# Patient Record
Sex: Male | Born: 1955 | Race: White | Hispanic: No | Marital: Married | Smoking: Never smoker
Health system: Southern US, Community
[De-identification: ages and names within clinical notes are randomized; demographics above are authoritative.]

## PROBLEM LIST (undated history)

## (undated) DIAGNOSIS — M1990 Unspecified osteoarthritis, unspecified site: Secondary | ICD-10-CM

## (undated) DIAGNOSIS — J45909 Unspecified asthma, uncomplicated: Secondary | ICD-10-CM

## (undated) DIAGNOSIS — G709 Myoneural disorder, unspecified: Secondary | ICD-10-CM

## (undated) DIAGNOSIS — I10 Essential (primary) hypertension: Secondary | ICD-10-CM

## (undated) HISTORY — PX: KNEE SURGERY: SHX244

## (undated) HISTORY — PX: NECK SURGERY: SHX720

---

## 1997-07-21 HISTORY — PX: CERVICAL DISC SURGERY: SHX588

## 1997-12-14 ENCOUNTER — Inpatient Hospital Stay (HOSPITAL_COMMUNITY): Admission: RE | Admit: 1997-12-14 | Discharge: 1997-12-16 | Payer: Self-pay | Admitting: Neurosurgery

## 1998-08-15 ENCOUNTER — Ambulatory Visit (HOSPITAL_BASED_OUTPATIENT_CLINIC_OR_DEPARTMENT_OTHER): Admission: RE | Admit: 1998-08-15 | Discharge: 1998-08-15 | Payer: Self-pay | Admitting: Orthopedic Surgery

## 1999-01-22 ENCOUNTER — Emergency Department (HOSPITAL_COMMUNITY): Admission: EM | Admit: 1999-01-22 | Discharge: 1999-01-22 | Payer: Self-pay | Admitting: Emergency Medicine

## 1999-07-08 ENCOUNTER — Encounter: Payer: Self-pay | Admitting: Orthopedic Surgery

## 1999-07-08 ENCOUNTER — Ambulatory Visit (HOSPITAL_COMMUNITY): Admission: RE | Admit: 1999-07-08 | Discharge: 1999-07-08 | Payer: Self-pay | Admitting: Orthopedic Surgery

## 1999-08-02 ENCOUNTER — Ambulatory Visit (HOSPITAL_BASED_OUTPATIENT_CLINIC_OR_DEPARTMENT_OTHER): Admission: RE | Admit: 1999-08-02 | Discharge: 1999-08-03 | Payer: Self-pay | Admitting: Orthopedic Surgery

## 2001-05-26 ENCOUNTER — Ambulatory Visit (HOSPITAL_BASED_OUTPATIENT_CLINIC_OR_DEPARTMENT_OTHER): Admission: RE | Admit: 2001-05-26 | Discharge: 2001-05-26 | Payer: Self-pay | Admitting: Orthopedic Surgery

## 2010-10-14 ENCOUNTER — Other Ambulatory Visit: Payer: Self-pay | Admitting: Neurological Surgery

## 2010-10-14 DIAGNOSIS — M542 Cervicalgia: Secondary | ICD-10-CM

## 2010-11-08 ENCOUNTER — Ambulatory Visit
Admission: RE | Admit: 2010-11-08 | Discharge: 2010-11-08 | Disposition: A | Discharge status: Home / Self Care | Payer: BC Managed Care – PPO | Source: Ambulatory Visit | Attending: Neurological Surgery | Admitting: Neurological Surgery

## 2010-11-08 ENCOUNTER — Other Ambulatory Visit: Payer: Self-pay | Admitting: Neurological Surgery

## 2010-11-08 DIAGNOSIS — M542 Cervicalgia: Secondary | ICD-10-CM

## 2011-02-04 ENCOUNTER — Other Ambulatory Visit: Payer: Self-pay | Admitting: Neurological Surgery

## 2011-02-04 ENCOUNTER — Ambulatory Visit
Admission: RE | Admit: 2011-02-04 | Discharge: 2011-02-04 | Disposition: A | Discharge status: Home / Self Care | Payer: BC Managed Care – PPO | Source: Ambulatory Visit | Attending: Neurological Surgery | Admitting: Neurological Surgery

## 2011-02-04 DIAGNOSIS — M542 Cervicalgia: Secondary | ICD-10-CM

## 2011-02-04 DIAGNOSIS — M47812 Spondylosis without myelopathy or radiculopathy, cervical region: Secondary | ICD-10-CM

## 2011-05-06 ENCOUNTER — Other Ambulatory Visit: Payer: Self-pay | Admitting: Neurological Surgery

## 2011-05-06 ENCOUNTER — Ambulatory Visit
Admission: RE | Admit: 2011-05-06 | Discharge: 2011-05-06 | Disposition: A | Discharge status: Home / Self Care | Payer: BC Managed Care – PPO | Source: Ambulatory Visit | Attending: Neurological Surgery | Admitting: Neurological Surgery

## 2011-05-06 DIAGNOSIS — M542 Cervicalgia: Secondary | ICD-10-CM

## 2011-05-06 DIAGNOSIS — M47812 Spondylosis without myelopathy or radiculopathy, cervical region: Secondary | ICD-10-CM

## 2013-01-31 ENCOUNTER — Emergency Department (HOSPITAL_COMMUNITY): Payer: BC Managed Care – PPO

## 2013-01-31 ENCOUNTER — Observation Stay (HOSPITAL_COMMUNITY)
Admission: EM | Admit: 2013-01-31 | Discharge: 2013-02-02 | Disposition: A | Discharge status: Home / Self Care | Payer: BC Managed Care – PPO | Attending: Family Medicine | Admitting: Family Medicine

## 2013-01-31 ENCOUNTER — Encounter (HOSPITAL_COMMUNITY): Payer: Self-pay | Admitting: *Deleted

## 2013-01-31 DIAGNOSIS — J45909 Unspecified asthma, uncomplicated: Secondary | ICD-10-CM | POA: Insufficient documentation

## 2013-01-31 DIAGNOSIS — I498 Other specified cardiac arrhythmias: Secondary | ICD-10-CM | POA: Insufficient documentation

## 2013-01-31 DIAGNOSIS — Z79899 Other long term (current) drug therapy: Secondary | ICD-10-CM | POA: Insufficient documentation

## 2013-01-31 DIAGNOSIS — R0789 Other chest pain: Principal | ICD-10-CM | POA: Diagnosis present

## 2013-01-31 DIAGNOSIS — R0602 Shortness of breath: Secondary | ICD-10-CM

## 2013-01-31 DIAGNOSIS — I10 Essential (primary) hypertension: Secondary | ICD-10-CM

## 2013-01-31 DIAGNOSIS — R001 Bradycardia, unspecified: Secondary | ICD-10-CM | POA: Diagnosis present

## 2013-01-31 HISTORY — DX: Unspecified asthma, uncomplicated: J45.909

## 2013-01-31 HISTORY — DX: Essential (primary) hypertension: I10

## 2013-01-31 LAB — BASIC METABOLIC PANEL
BUN: 16 mg/dL (ref 6–23)
CO2: 28 mEq/L (ref 19–32)
Calcium: 9.7 mg/dL (ref 8.4–10.5)
Chloride: 101 mEq/L (ref 96–112)
Creatinine, Ser: 1.01 mg/dL (ref 0.50–1.35)
GFR calc Af Amer: >90 mL/min (ref >90)
GFR calc non Af Amer: 81 mL/min — ABNORMAL LOW (ref >90)
Glucose, Bld: 86 mg/dL (ref 70–99)
Potassium: 3.6 mEq/L (ref 3.5–5.1)
Sodium: 138 mEq/L (ref 135–145)

## 2013-01-31 LAB — CBC
HCT: 43.8 % (ref 39.0–52.0)
HCT: 43.4 % (ref 39.0–52.0)
Hemoglobin: 15.3 g/dL (ref 13.0–17.0)
Hemoglobin: 15 g/dL (ref 13.0–17.0)
MCH: 31.6 pg (ref 26.0–34.0)
MCH: 31.4 pg (ref 26.0–34.0)
MCHC: 34.9 g/dL (ref 30.0–36.0)
MCHC: 34.6 g/dL (ref 30.0–36.0)
MCV: 90.5 fL (ref 78.0–100.0)
MCV: 91 fL (ref 78.0–100.0)
Platelets: 227 10*3/uL (ref 150–400)
Platelets: 233 10*3/uL (ref 150–400)
RBC: 4.84 MIL/uL (ref 4.22–5.81)
RBC: 4.77 MIL/uL (ref 4.22–5.81)
RDW: 12.5 % (ref 11.5–15.5)
RDW: 12.5 % (ref 11.5–15.5)
WBC: 7.3 10*3/uL (ref 4.0–10.5)
WBC: 7.4 10*3/uL (ref 4.0–10.5)

## 2013-01-31 LAB — POCT I-STAT TROPONIN I: Troponin i, poc: 0 ng/mL (ref 0.00–0.08)

## 2013-01-31 LAB — CREATININE, SERUM
Creatinine, Ser: 1.03 mg/dL (ref 0.50–1.35)
GFR calc Af Amer: >90 mL/min (ref >90)
GFR calc non Af Amer: 79 mL/min — ABNORMAL LOW (ref >90)

## 2013-01-31 LAB — D-DIMER, QUANTITATIVE (NOT AT ARMC): D-Dimer, Quant: <0.27 ug/mL-FEU (ref 0.00–0.48)

## 2013-01-31 LAB — TROPONIN I
Troponin I: <0.3 ng/mL (ref <0.30)
Troponin I: <0.3 ng/mL (ref <0.30)

## 2013-01-31 MED ORDER — SODIUM CHLORIDE 0.9 % IJ SOLN: 3.0000 mL | INTRAMUSCULAR | Status: DC | PRN

## 2013-01-31 MED ORDER — LOSARTAN POTASSIUM-HCTZ 50-12.5 MG PO TABS: 1.0000 | ORAL_TABLET | Freq: Every day | ORAL | Status: DC

## 2013-01-31 MED ORDER — ACETAMINOPHEN 325 MG PO TABS: 650.0000 mg | ORAL_TABLET | Freq: Four times a day (QID) | ORAL | Status: DC | PRN

## 2013-01-31 MED ORDER — LOSARTAN POTASSIUM 50 MG PO TABS
50.0000 mg | ORAL_TABLET | Freq: Every day | ORAL | Status: DC
  Administered 2013-01-31 – 2013-02-02 (×3): 50 mg via ORAL
  Filled 2013-01-31 (×3): qty 1

## 2013-01-31 MED ORDER — SODIUM CHLORIDE 0.9 % IV SOLN: 250.0000 mL | INTRAVENOUS | Status: DC | PRN

## 2013-01-31 MED ORDER — ASPIRIN 81 MG PO CHEW
324.0000 mg | CHEWABLE_TABLET | Freq: Once | ORAL | Status: AC
  Administered 2013-01-31: 324 mg via ORAL
  Filled 2013-01-31: qty 4

## 2013-01-31 MED ORDER — HEPARIN SODIUM (PORCINE) 5000 UNIT/ML IJ SOLN
5000.0000 [IU] | Freq: Three times a day (TID) | INTRAMUSCULAR | Status: DC
  Filled 2013-01-31 (×8): qty 1

## 2013-01-31 MED ORDER — SODIUM CHLORIDE 0.9 % IJ SOLN: 3.0000 mL | Freq: Two times a day (BID) | INTRAMUSCULAR | Status: DC

## 2013-01-31 MED ORDER — ACETAMINOPHEN 650 MG RE SUPP: 650.0000 mg | Freq: Four times a day (QID) | RECTAL | Status: DC | PRN

## 2013-01-31 MED ORDER — ASPIRIN EC 325 MG PO TBEC
325.0000 mg | DELAYED_RELEASE_TABLET | Freq: Every day | ORAL | Status: DC
  Administered 2013-01-31 – 2013-02-02 (×3): 325 mg via ORAL
  Filled 2013-01-31 (×3): qty 1

## 2013-01-31 MED ORDER — SODIUM CHLORIDE 0.9 % IJ SOLN
3.0000 mL | Freq: Two times a day (BID) | INTRAMUSCULAR | Status: DC
  Administered 2013-01-31 – 2013-02-01 (×3): 3 mL via INTRAVENOUS

## 2013-01-31 MED ORDER — HYDROCODONE-ACETAMINOPHEN 5-325 MG PO TABS: 1.0000 | ORAL_TABLET | ORAL | Status: DC | PRN

## 2013-01-31 MED ORDER — HYDROCHLOROTHIAZIDE 12.5 MG PO CAPS
12.5000 mg | ORAL_CAPSULE | Freq: Every day | ORAL | Status: DC
  Administered 2013-01-31 – 2013-02-02 (×3): 12.5 mg via ORAL
  Filled 2013-01-31 (×3): qty 1

## 2013-01-31 NOTE — Progress Notes (Signed)
Patient reports his pcp is Dr. Tiburcio Pea of [facility].

## 2013-01-31 NOTE — H&P (Signed)
[facility] History and Physical  Daniel Rush JYN:[id number] DOB: 1956/03/15 DOA: 01/31/2013  Referring physician: Dr. Danae Chen PCP: Provider Not In System  Specialists: none  Chief Complaint: Atypical chest pain  HPI: Daniel Rush is a 57 y.o. male  With past medical history of hypertension that comes in chest discomfort this started 2 weeks prior to admission. He relates usually swims and runs sometimes up to an hour and a half. The first time he got this episode in the past 2 weeks was while doing this. He relates he's gotten them more often to the point where he gets him where his sitting down. He relates is no relationship with food, he may some shortness of breath with this episode, no palpitation no shortness of breath nausea vomiting or diarrhea. He relates nothing makes it better or worse. He exercises regularly.  In the ED: EKG was done that showed sinus bradycardia with no abnormal T-wave changes some LVH pattern, first set of cardiac enzymes are negative so we are consulted for further evaluation.  Review of Systems: The patient denies anorexia, fever, weight loss,, vision loss, decreased hearing, hoarseness, chest pain, peripheral edema, balance deficits, hemoptysis, abdominal pain, melena, hematochezia, severe indigestion/heartburn, hematuria, incontinence, genital sores, muscle weakness, suspicious skin lesions, transient blindness, difficulty walking, depression, unusual weight change, abnormal bleeding, enlarged lymph nodes, angioedema, and breast masses.    Past Medical History  Diagnosis Date  . Asthma   . Hypertension    Past Surgical History  Procedure Laterality Date  . Neck surgery    . Knee surgery     Social History:  reports that he has never smoked. He does not have any smokeless tobacco history on file. He reports that he drinks about 1.2 ounces of alcohol per week. He reports that he does not use illicit drugs.   Allergies  Allergen Reactions   . Latex Rash    Family History  Problem Relation Age of Onset  . Alzheimer's disease Mother   . Hypertension Mother   . Diabetes Mellitus II Mother   . Heart failure Father   . Diabetes Mellitus II Father   . Hypertension Father    Prior to Admission medications   Medication Sig Start Date End Date Taking? Authorizing Provider  Cyanocobalamin (VITAMIN B-12 PO) Take 1 capsule by mouth daily.   Yes Historical Provider, MD  LECITHIN PO Take 1 capsule by mouth daily.   Yes Historical Provider, MD  losartan-hydrochlorothiazide (HYZAAR) 50-12.5 MG per tablet Take 1 tablet by mouth daily.   Yes Historical Provider, MD  Multiple Vitamins-Minerals (MULTIVITAMIN WITH MINERALS) tablet Take 1 tablet by mouth daily.   Yes Historical Provider, MD  ST JOHNS WORT PO Take 1 tablet by mouth.   Yes Historical Provider, MD  testosterone (ANDROGEL) 50 MG/5GM GEL Place 5 g onto the skin daily.   Yes Historical Provider, MD  VITAMIN E PO Take 1 capsule by mouth.   Yes Historical Provider, MD   Physical Exam: Filed Vitals:   01/31/13 1342  BP: 145/81  Pulse: 53  Temp: 98.1 F (36.7 C)  TempSrc: Oral  Resp: 16  SpO2: 98%    BP 145/81  Pulse 53  Temp(Src) 98.1 F (36.7 C) (Oral)  Resp 16  SpO2 98%  General Appearance:    Alert, cooperative, no distress, appears stated age, well built               Throat:   Lips, mucosa, and tongue normal; teeth  and gums normal  Neck:   Supple, symmetrical, trachea midline, no adenopathy;       thyroid:  No enlargement/tenderness/nodules; no carotid   bruit or JVD  Back:     Symmetric, no curvature, ROM normal, no CVA tenderness  Lungs:     Clear to auscultation bilaterally, respirations unlabored  Chest wall:    No tenderness or deformity  Heart:    Regular rate and rhythm, S1 and S2 normal, systolic murmur in the aortic area.   Abdomen:     Soft, non-tender, bowel sounds active all four quadrants,    no masses, no organomegaly        Extremities:    Extremities normal, atraumatic, no cyanosis or edema  Pulses:   2+ and symmetric all extremities  Skin:   Skin color, texture, turgor normal, no rashes or lesions  Lymph nodes:   Cervical, supraclavicular, and axillary nodes normal  Neurologic:   CNII-XII intact. Normal strength, sensation and reflexes      throughout      Labs on Admission:  Basic Metabolic Panel:  Recent Labs Lab 01/31/13 1430  NA 138  K 3.6  CL 101  CO2 28  GLUCOSE 86  BUN 16  CREATININE 1.01  CALCIUM 9.7   Liver Function Tests: No results found for this basename: AST, ALT, ALKPHOS, BILITOT, PROT, ALBUMIN,  in the last 168 hours No results found for this basename: LIPASE, AMYLASE,  in the last 168 hours No results found for this basename: AMMONIA,  in the last 168 hours CBC:  Recent Labs Lab 01/31/13 1430  WBC 7.3  HGB 15.3  HCT 43.8  MCV 90.5  PLT 227   Cardiac Enzymes:  Recent Labs Lab 01/31/13 1605  TROPONINI <0.30    BNP (last 3 results) No results found for this basename: PROBNP,  in the last 8760 hours CBG: No results found for this basename: GLUCAP,  in the last 168 hours  Radiological Exams on Admission: Dg Chest 2 View  01/31/2013   *RADIOLOGY REPORT*  Clinical Data: Chest pain  CHEST - 2 VIEW  Comparison: None.  Findings: Cardiomediastinal silhouette is unremarkable.  Mild thoracic dextroscoliosis.  No acute infiltrate or pleural effusion. No pulmonary edema.  IMPRESSION: No active disease.  Mild thoracic dextroscoliosis.   Original Report Authenticated By: Natasha Mead, M.D.    EKG: Independently reviewed. Sinus bradycardia with normal axis no T wave abnormalities LVH pattern.  Assessment/Plan Atypical chest pain: - I will go ahead and admit him under observation, to telemetry unit, we'll cycle his cardiac enzymes x3, we'll check a TSH and a 2-D echo. We'll give him aspirin orally. - His heart score is 2 if his cardiac enzymes remain negative the schedule stress test as an  outpatient.  Hypertension, essential: - Continue current home medications.  Sinus bradycardia: - He's currently on low rate and tone medication, he seems well built male who exercises regularly.   Code Status: full Family Communication: none Disposition Plan: observation  Time spent: 65 minutes  Marinda Elk [facility] Pager [phone]  If 7PM-7AM, please contact night-coverage [email or web address] Password [facility] 01/31/2013, 5:58 PM

## 2013-01-31 NOTE — ED Notes (Signed)
Pt presents to ed with  c/o sob x 1 week. Reports feels like "jumping into cold water and having your breath taken away". Pt reports hx of asthma but sts this is different. Denies any cardiac hx.

## 2013-01-31 NOTE — ED Provider Notes (Signed)
History    CSN: [id number] Arrival date & time 01/31/13  1328  First MD Initiated Contact with Patient 01/31/13 1502     Chief Complaint  Patient presents with  . Shortness of Breath   (Consider location/radiation/quality/duration/timing/severity/associated sxs/prior Treatment) HPI Comments: 57 yo male with htn hx presents with sob and chest pressure.  Brief (seconds) episodes daily, gradually worsening the past 2 weeks.  Pt has had similar random episodes but now more frequent and at times with exercise.  Pt healthy, exercises regular.  No diaphoresis or vomiting.  Patient denies blood clot history, active cancer, recent major trauma or surgery, unilateral leg swelling/ pain,, hemoptysis or oral contraceptives. Non radiating  Patient is a 57 y.o. male presenting with shortness of breath. The history is provided by the patient.  Shortness of Breath Severity:  Mild Associated symptoms: no abdominal pain, no chest pain (pressure), no fever, no headaches, no neck pain, no rash and no vomiting    Past Medical History  Diagnosis Date  . Asthma   . Hypertension    Past Surgical History  Procedure Laterality Date  . Neck surgery    . Knee surgery     History reviewed. No pertinent family history. History  Substance Use Topics  . Smoking status: Never Smoker   . Smokeless tobacco: Not on file  . Alcohol Use: 1.2 oz/week    2 Glasses of wine per week     Comment: daily with dinner    Review of Systems  Constitutional: Negative for fever and chills.  HENT: Negative for neck pain and neck stiffness.   Eyes: Negative for visual disturbance.  Respiratory: Positive for shortness of breath.   Cardiovascular: Negative for chest pain (pressure).  Gastrointestinal: Negative for vomiting and abdominal pain.  Genitourinary: Negative for dysuria and flank pain.  Musculoskeletal: Negative for back pain.  Skin: Negative for rash.  Neurological: Negative for light-headedness and headaches.     Allergies  Latex  Home Medications   Current Outpatient Rx  Name  Route  Sig  Dispense  Refill  . Cyanocobalamin (VITAMIN B-12 PO)   Oral   Take 1 capsule by mouth daily.         Marland Kitchen LECITHIN PO   Oral   Take 1 capsule by mouth daily.         Marland Kitchen losartan-hydrochlorothiazide (HYZAAR) 50-12.5 MG per tablet   Oral   Take 1 tablet by mouth daily.         . Multiple Vitamins-Minerals (MULTIVITAMIN WITH MINERALS) tablet   Oral   Take 1 tablet by mouth daily.         . ST JOHNS WORT PO   Oral   Take 1 tablet by mouth.         . testosterone (ANDROGEL) 50 MG/5GM GEL   Transdermal   Place 5 g onto the skin daily.         Marland Kitchen VITAMIN E PO   Oral   Take 1 capsule by mouth.          BP 145/81  Pulse 53  Temp(Src) 98.1 F (36.7 C) (Oral)  Resp 16  SpO2 98% Physical Exam  Nursing note and vitals reviewed. Constitutional: He is oriented to person, place, and time. He appears well-developed and well-nourished.  HENT:  Head: Normocephalic and atraumatic.  Eyes: Conjunctivae are normal. Right eye exhibits no discharge. Left eye exhibits no discharge.  Neck: Normal range of motion. Neck supple. No tracheal deviation  present.  Cardiovascular: Normal rate and regular rhythm.   Murmur (2+ SM LSB) heard. Pulmonary/Chest: Effort normal and breath sounds normal.  Abdominal: Soft. He exhibits no distension. There is no tenderness. There is no guarding.  Musculoskeletal: He exhibits no edema and no tenderness.  Neurological: He is alert and oriented to person, place, and time.  Skin: Skin is warm. No rash noted.  Psychiatric: He has a normal mood and affect.    ED Course  Procedures (including critical care time) Labs Reviewed  BASIC METABOLIC PANEL - Abnormal; Notable for the following:    GFR calc non Af Amer 81 (*)    All other components within normal limits  CBC  D-DIMER, QUANTITATIVE  POCT I-STAT TROPONIN I   No results found. No diagnosis found.  MDM   Chest pain and sob episodes, sometimes with exertion. Work up in ED no acute findings, asa given. DIscussed outpt fup vs observation, pt chose observation for enzymes and possible stress. Spoke with Dr Radonna Ricker. Observation placed.  CP free  Date: 01/31/2013  Rate: 52   Rhythm: normal sinus rhythm  QRS Axis: normal  Intervals: normal  ST/T Wave abnormalities: normal  Conduction Disutrbances:none  RSR variant ?LVH  Narrative Interpretation:   Old EKG Reviewed: none available    Enid Skeens, MD 01/31/13 [phone]

## 2013-02-01 DIAGNOSIS — I498 Other specified cardiac arrhythmias: Secondary | ICD-10-CM

## 2013-02-01 DIAGNOSIS — R001 Bradycardia, unspecified: Secondary | ICD-10-CM | POA: Diagnosis present

## 2013-02-01 LAB — HEMOGLOBIN A1C
Hgb A1c MFr Bld: 5.3 % (ref <5.7)
Mean Plasma Glucose: 105 mg/dL (ref <117)

## 2013-02-01 LAB — TROPONIN I
Troponin I: <0.3 ng/mL (ref <0.30)
Troponin I: <0.3 ng/mL (ref <0.30)

## 2013-02-01 LAB — TSH: TSH: 2.301 u[IU]/mL (ref 0.350–4.500)

## 2013-02-01 NOTE — Progress Notes (Signed)
  Echocardiogram 2D Echocardiogram has been performed.  Cathie Beams 02/01/2013, 9:43 AM

## 2013-02-01 NOTE — Progress Notes (Signed)
Stopped to see patient today. Had prayer, offered encouragement and support. Listened.

## 2013-02-01 NOTE — Progress Notes (Signed)
[facility] PROGRESS NOTE  Daniel Rush ZOX:[id number] DOB: February 19, 1956 DOA: 01/31/2013 PCP: Provider Not In System  Brief narrative 57 year old male, physically quite active (elliptical, weightlifting and Karate), history of hypertension, admitted on 01/31/13 with complaints of chest pain. Patient gives history of fleeting chest pain in the midsternal area that occurred up to 3 times per year for the last 5 years. However over the last 2 weeks patient has noted increased frequency of chest pain. He indicates midsternal, pressure-like, nonradiating chest pain which occurs both with activity and at rest, last a few seconds and resolve spontaneously. He denies relationship to deep breathing or food. At times he may have to catch his breath. Denies heartburn. Hospitalist admission was requested.  Assessment/Plan: 1. Atypical chest pain: Cardiac enzymes cycled and negative. Discussed with cardiology who evaluated 2-D echo and? Lateral wall hypokinesis & LVEF 50-55%. Cardiology consultation appreciated-recommend stress test in a.m. Chest pain resolved for the last 24 hours. Hemoglobin A1c: 5.3. EKG: Sinus bradycardia at 52 beats per minute, RSR in V1 and V2 ? Normal variant and LVH pattern. 2. Hypertension: Controlled. Continue HCTZ and losartan. 3. Sinus bradycardia: Likely physiologic. Monitor on telemetry. TSH: 2.3  Code Status: Full Family Communication: Discussed with spouse at bedside. Disposition Plan: Home when medically cleared.   Consultants:  Cardiology  Procedures:  None  Antibiotics:  None   HPI/Subjective: No further chest pain since 7/14 a.m.  Objective: Filed Vitals:   01/31/13 1840 01/31/13 2036 02/01/13 0652 02/01/13 1343  BP: 133/77 134/81 142/85 120/66  Pulse: 51 49 47 57  Temp: 98.1 F (36.7 C) 98.2 F (36.8 C) 97.3 F (36.3 C) 97.8 F (36.6 C)  TempSrc: Oral Oral Oral Oral  Resp: 18 16 16 18   Height:  5\' 9"  (1.753 m)    Weight:  68.04 kg (150 lb)  67.903 kg (149 lb 11.2 oz)   SpO2: 98% 98% 99% 100%    Intake/Output Summary (Last 24 hours) at 02/01/13 1525 Last data filed at 02/01/13 1312  Gross per 24 hour  Intake    774 ml  Output      0 ml  Net    774 ml   Filed Weights   01/31/13 2036 02/01/13 0652  Weight: 68.04 kg (150 lb) 67.903 kg (149 lb 11.2 oz)    Exam:   General exam: Comfortable. Ambulating in room.  Respiratory system: Clear. No increased work of breathing. No reproducible chest wall tenderness.  Cardiovascular system: S1 & S2 heard, RRR. No JVD, murmurs, gallops, clicks or pedal edema. Telemetry shows sinus bradycardia mostly in the 50s but at times in the 40.  Gastrointestinal system: Abdomen is nondistended, soft and nontender. Normal bowel sounds heard.  Central nervous system: Alert and oriented. No focal neurological deficits.  Extremities: Symmetric 5 x 5 power.   Data Reviewed: Basic Metabolic Panel:  Recent Labs Lab 01/31/13 1430 01/31/13 2145  NA 138  --   K 3.6  --   CL 101  --   CO2 28  --   GLUCOSE 86  --   BUN 16  --   CREATININE 1.01 1.03  CALCIUM 9.7  --    Liver Function Tests: No results found for this basename: AST, ALT, ALKPHOS, BILITOT, PROT, ALBUMIN,  in the last 168 hours No results found for this basename: LIPASE, AMYLASE,  in the last 168 hours No results found for this basename: AMMONIA,  in the last 168 hours CBC:  Recent Labs Lab 01/31/13  1430 01/31/13 2145  WBC 7.3 7.4  HGB 15.3 15.0  HCT 43.8 43.4  MCV 90.5 91.0  PLT 227 233   Cardiac Enzymes:  Recent Labs Lab 01/31/13 1605 01/31/13 2145 02/01/13 0300 02/01/13 0750  TROPONINI <0.30 <0.30 <0.30 <0.30   BNP (last 3 results) No results found for this basename: PROBNP,  in the last 8760 hours CBG: No results found for this basename: GLUCAP,  in the last 168 hours  No results found for this or any previous visit (from the past 240 hour(s)).   Studies: Dg Chest 2 View  01/31/2013    *RADIOLOGY REPORT*  Clinical Data: Chest pain  CHEST - 2 VIEW  Comparison: None.  Findings: Cardiomediastinal silhouette is unremarkable.  Mild thoracic dextroscoliosis.  No acute infiltrate or pleural effusion. No pulmonary edema.  IMPRESSION: No active disease.  Mild thoracic dextroscoliosis.   Original Report Authenticated By: Natasha Mead, M.D.     Additional labs:   Scheduled Meds: . aspirin EC  325 mg Oral Daily  . heparin  5,000 Units Subcutaneous Q8H  . hydrochlorothiazide  12.5 mg Oral Daily  . losartan  50 mg Oral Daily  . sodium chloride  3 mL Intravenous Q12H  . sodium chloride  3 mL Intravenous Q12H   Continuous Infusions:   Principal Problem:   Atypical chest pain Active Problems:   Hypertension, essential   Sinus bradycardia    Time spent: 40 minutes    [facility]  [facility] Pager [phone].   If 8PM-8AM, please contact night-coverage at [email or web address], password [facility] 02/01/2013, 3:25 PM  LOS: 1 day

## 2013-02-01 NOTE — Consult Note (Signed)
Admit date: 01/31/2013 Referring Physician  Dr. Waymon Amato Primary Cardiologist  None Reason for Consultation  Chest pain  HPI: Daniel Rush is a 57 y.o. male with past medical history of hypertension that comes in chest discomfort that started 2 weeks prior to admission. He relates usually swimming and running sometimes up to an hour and a half. The first time he got this episode in the past 2 weeks was while doing this. He relates he's gotten them more often to the point where he gets them when he is sitting down. He relates there is no relationship with food, he may some shortness of breath with this episode, no palpitation,nausea, vomiting or diarrhea. He relates nothing makes it better or worse. He exercises regularly. The pain is sharp and only lasts a few seconds.  On EKG he has an incomplete RBBB with T wave inversions in V1 and V2 and LVH.  Cardiology is now asked to consult for workup of chest pain.        PMH:   Past Medical History  Diagnosis Date  . Asthma   . Hypertension      PSH:   Past Surgical History  Procedure Laterality Date  . Neck surgery    . Knee surgery      Allergies:  Latex Prior to Admit Meds:   Prescriptions prior to admission  Medication Sig Dispense Refill  . Cyanocobalamin (VITAMIN B-12 PO) Take 1 capsule by mouth daily.      Marland Kitchen LECITHIN PO Take 1 capsule by mouth daily.      Marland Kitchen losartan-hydrochlorothiazide (HYZAAR) 50-12.5 MG per tablet Take 1 tablet by mouth daily.      . Multiple Vitamins-Minerals (MULTIVITAMIN WITH MINERALS) tablet Take 1 tablet by mouth daily.      . ST JOHNS WORT PO Take 1 tablet by mouth.      . testosterone (ANDROGEL) 50 MG/5GM GEL Place 5 g onto the skin daily.      Marland Kitchen VITAMIN E PO Take 1 capsule by mouth.       Fam HX:    Family History  Problem Relation Age of Onset  . Alzheimer's disease Mother   . Hypertension Mother   . Diabetes Mellitus II Mother   . Heart failure Father   . Diabetes Mellitus II Father   .  Hypertension Father    Social HX:    History   Social History  . Marital Status: Married    Spouse Name: N/A    Number of Children: N/A  . Years of Education: N/A   Occupational History  . Not on file.   Social History Main Topics  . Smoking status: Never Smoker   . Smokeless tobacco: Not on file  . Alcohol Use: 1.2 oz/week    2 Glasses of wine per week     Comment: daily with dinner  . Drug Use: No  . Sexually Active: Yes   Other Topics Concern  . Not on file   Social History Narrative  . No narrative on file     ROS:  All 11 ROS were addressed and are negative except what is stated in the HPI  Physical Exam: Blood pressure 142/85, pulse 47, temperature 97.3 F (36.3 C), temperature source Oral, resp. rate 16, height 5\' 9"  (1.753 m), weight 67.903 kg (149 lb 11.2 oz), SpO2 99.00%.    General: Well developed, well nourished, in no acute distress Head: Eyes PERRLA, No xanthomas.   Normal cephalic and atramatic  Lungs:   Clear bilaterally to auscultation and percussion. Heart:   HRRR S1 S2 Pulses are 2+ & equal.            No carotid bruit. No JVD.  No abdominal bruits. No femoral bruits. Abdomen: Bowel sounds are positive, abdomen soft and non-tender without masses  Extremities:   No clubbing, cyanosis or edema.  DP +1 Neuro: Alert and oriented X 3. Psych:  Good affect, responds appropriately    Labs:   Lab Results  Component Value Date   WBC 7.4 01/31/2013   HGB 15.0 01/31/2013   HCT 43.4 01/31/2013   MCV 91.0 01/31/2013   PLT 233 01/31/2013    Recent Labs Lab 01/31/13 1430 01/31/13 2145  NA 138  --   K 3.6  --   CL 101  --   CO2 28  --   BUN 16  --   CREATININE 1.01 1.03  CALCIUM 9.7  --   GLUCOSE 86  --    No results found for this basename: PTT   No results found for this basename: INR, PROTIME   Lab Results  Component Value Date   TROPONINI <0.30 02/01/2013         Radiology:  Dg Chest 2 View  01/31/2013   *RADIOLOGY REPORT*  Clinical  Data: Chest pain  CHEST - 2 VIEW  Comparison: None.  Findings: Cardiomediastinal silhouette is unremarkable.  Mild thoracic dextroscoliosis.  No acute infiltrate or pleural effusion. No pulmonary edema.  IMPRESSION: No active disease.  Mild thoracic dextroscoliosis.   Original Report Authenticated By: Natasha Mead, M.D.    EKG:  NSR with IRBBB and T wave inversions in V1 and V2, LVH by voltage  ASSESSMENT:  1.  Chest pain primarily with atypical components but he has an abnormal EKG and 2D echo with low normal LVF EF 50-55% with ? Lateral wall HK.  CFR include age, male sex and HTN. 2.  HTN 3.  Asthma  PLAN:   1.  NPO after midnight 2.  Stress CL in am  Quintella Reichert, MD  02/01/2013  1:21 PM

## 2013-02-02 ENCOUNTER — Encounter (HOSPITAL_COMMUNITY)
Admit: 2013-02-02 | Discharge: 2013-02-02 | Disposition: A | Discharge status: Home / Self Care | Payer: BC Managed Care – PPO | Attending: Cardiology | Admitting: Cardiology

## 2013-02-02 ENCOUNTER — Other Ambulatory Visit: Payer: Self-pay

## 2013-02-02 MED ORDER — TECHNETIUM TC 99M SESTAMIBI GENERIC - CARDIOLITE
10.0000 | Freq: Once | INTRAVENOUS | Status: AC | PRN
  Administered 2013-02-02: 10 via INTRAVENOUS

## 2013-02-02 NOTE — Discharge Summary (Signed)
Physician Discharge Summary  Daniel Rush EAV:[id number] DOB: [birth year] DOA: 01/31/2013  PCP: Provider Not In System  Admit date: 01/31/2013 Discharge date: 02/02/2013  Time spent: 50 minutes  Recommendations for Outpatient Follow-up:  1. Follow up PCP in 2 weeks  Discharge Diagnoses:  Principal Problem:   Atypical chest pain Active Problems:   Hypertension, essential   Sinus bradycardia   Discharge Condition: Stable  Diet recommendation: low salt diet  Filed Weights   01/31/13 2036 02/01/13 0652 02/02/13 0500  Weight: 68.04 kg (150 lb) 67.903 kg (149 lb 11.2 oz) 67.7 kg (149 lb 4 oz)    History of present illness:  With past medical history of hypertension that comes in chest discomfort this started 2 weeks prior to admission. He relates usually swims and runs sometimes up to an hour and a half. The first time he got this episode in the past 2 weeks was while doing this. He relates he's gotten them more often to the point where he gets him where his sitting down. He relates is no relationship with food, he may some shortness of breath with this episode, no palpitation no shortness of breath nausea vomiting or diarrhea. He relates nothing makes it better or worse. He exercises regularly.     Hospital Course:  1. Atypical chest pain: Cardiac enzymes cycled and negative. Discussed with cardiology who evaluated 2-D echo and? Lateral wall hypokinesis & LVEF 50-55%. Cardiology consultation was obtained, patient underwent cardiac stress stest which is  Negative for ischemia and  Shows mildly reduced ejection fraction of 48%. Called and discussed with Dr Anne Fu, no new recommendation, no need of aspirin at this time. Can follow up cardiology as needed.  2. Hypertension: Controlled. Continue HCTZ and losartan. 3. Sinus bradycardia: Likely physiologic.  TSH: 2.3  Procedures: Nuclear Cardiac stress test:1. Negative for inducible ischemia or infarction.  2. Mild hypokinesis with  ejection fraction of 48%.    Consultations: Cardiology  Discharge Exam: Filed Vitals:   02/02/13 1104 02/02/13 1105 02/02/13 1107 02/02/13 1436  BP: 188/98 143/112 192/92 127/83  Pulse:    53  Temp:    97.7 F (36.5 C)  TempSrc:    Oral  Resp:    16  Height:      Weight:      SpO2:    100%    General: Appear in no acute distress Cardiovascular: s1s2 RRR Respiratory: Clear bilaterally  Discharge Instructions  Discharge Orders   Future Orders Complete By Expires     Diet - low sodium heart healthy  As directed     Increase activity slowly  As directed         Medication List         LECITHIN PO  Take 1 capsule by mouth daily.     losartan-hydrochlorothiazide 50-12.5 MG per tablet  Commonly known as:  HYZAAR  Take 1 tablet by mouth daily.     multivitamin with minerals tablet  Take 1 tablet by mouth daily.     ST JOHNS WORT PO  Take 1 tablet by mouth.     testosterone 50 MG/5GM Gel  Commonly known as:  ANDROGEL  Place 5 g onto the skin daily.     VITAMIN B-12 PO  Take 1 capsule by mouth daily.     VITAMIN E PO  Take 1 capsule by mouth.       Allergies  Allergen Reactions  . Latex Rash       Follow-up Information  Follow up with [name], MD In 2 weeks.   Contact information:   [facility] [address] [place] [address] [phone]        The results of significant diagnostics from this hospitalization (including imaging, microbiology, ancillary and laboratory) are listed below for reference.    Significant Diagnostic Studies: Dg Chest 2 View  01/31/2013   *RADIOLOGY REPORT*  Clinical Data: Chest pain  CHEST - 2 VIEW  Comparison: None.  Findings: Cardiomediastinal silhouette is unremarkable.  Mild thoracic dextroscoliosis.  No acute infiltrate or pleural effusion. No pulmonary edema.  IMPRESSION: No active disease.  Mild thoracic dextroscoliosis.   Original Report Authenticated By: Natasha Mead,  M.D.   Nm Myocar Single W/spect W/wall Motion And Ef  02/02/2013   *RADIOLOGY REPORT*  Clinical Data:  Chest pain.  MYOCARDIAL IMAGING WITH SPECT (REST AND EXERCISE) GATED LEFT VENTRICULAR WALL MOTION STUDY LEFT VENTRICULAR EJECTION FRACTION  Technique:  Standard myocardial SPECT imaging was performed after resting intravenous injection of  10 mCi Tc-71m sestamibi. Subsequently, exercise tolerance test was performed by the patient under the supervision of the Cardiology staff.  At peak-stress, 30 mCi Tc-64m sestamibiwas injected intravenously and standard myocardial SPECT imaging was performed.  Quantitative gated imaging was also performed to evaluate left ventricular wall motion, and estimate left ventricular ejection fraction.  Comparison:  01/31/2013.  Findings: Target heart rate was 164 beats per minute.  Achieved heart rate was 146 beats per minute.  Time on treadmill was 12 minutes. Stage 4 was completed.  Mild dilation of the left ventricle is present.  There is no inducible ischemia.  Wall motion is mildly hypokinetic.  The end- diastolic volume is 120 ml.  End-systolic volume is 62 ml. Calculated ejection fraction is 48%.  IMPRESSION:  1.  Negative for inducible ischemia or infarction. 2.  Mild hypokinesis with ejection fraction of 48%.   Original Report Authenticated By: Andreas Newport, M.D.    Microbiology: No results found for this or any previous visit (from the past 240 hour(s)).   Labs: Basic Metabolic Panel:  Recent Labs Lab 01/31/13 1430 01/31/13 2145  NA 138  --   K 3.6  --   CL 101  --   CO2 28  --   GLUCOSE 86  --   BUN 16  --   CREATININE 1.01 1.03  CALCIUM 9.7  --    Liver Function Tests: No results found for this basename: AST, ALT, ALKPHOS, BILITOT, PROT, ALBUMIN,  in the last 168 hours No results found for this basename: LIPASE, AMYLASE,  in the last 168 hours No results found for this basename: AMMONIA,  in the last 168 hours CBC:  Recent Labs Lab  01/31/13 1430 01/31/13 2145  WBC 7.3 7.4  HGB 15.3 15.0  HCT 43.8 43.4  MCV 90.5 91.0  PLT 227 233   Cardiac Enzymes:  Recent Labs Lab 01/31/13 1605 01/31/13 2145 02/01/13 0300 02/01/13 0750  TROPONINI <0.30 <0.30 <0.30 <0.30   BNP: BNP (last 3 results) No results found for this basename: PROBNP,  in the last 8760 hours CBG: No results found for this basename: GLUCAP,  in the last 168 hours     Signed:  Levie Wages S  [facility] 02/02/2013, 4:24 PM

## 2013-02-02 NOTE — Progress Notes (Signed)
Paged Dr. Anne Fu regarding negative results of pt's stress test earlier in the day. Per Dr. Anne Fu, pt is okay to discharge home. Dr. Sharl Ma notified.

## 2013-02-02 NOTE — Care Management Note (Signed)
    Page 1 of 1   02/02/2013     2:41:25 PM   CARE MANAGEMENT NOTE 02/02/2013  Patient:  DEWARREN, LEDBETTER   Account Number:  [id number]  Date Initiated:  02/02/2013  Documentation initiated by:  Lanier Clam  Subjective/Objective Assessment:   ADMITTED W/ATYPICAL CHEST PAIN.     Action/Plan:   FROM HOME.   Anticipated DC Date:  02/02/2013   Anticipated DC Plan:  HOME/SELF CARE      DC Planning Services  CM consult      Choice offered to / List presented to:             Status of service:  In process, will continue to follow Medicare Important Message given?   (If response is "NO", the following Medicare IM given date fields will be blank) Date Medicare IM given:   Date Additional Medicare IM given:    Discharge Disposition:    Per UR Regulation:  Reviewed for med. necessity/level of care/duration of stay  If discussed at Long Length of Stay Meetings, dates discussed:    Comments:  02/02/13 Linnaea Ahn RN,BSN NCM [phone] FOR STRESS TEST.

## 2013-12-22 ENCOUNTER — Other Ambulatory Visit: Payer: Self-pay | Admitting: *Deleted

## 2013-12-22 DIAGNOSIS — R29898 Other symptoms and signs involving the musculoskeletal system: Secondary | ICD-10-CM

## 2014-02-08 ENCOUNTER — Ambulatory Visit (INDEPENDENT_AMBULATORY_CARE_PROVIDER_SITE_OTHER): Payer: BC Managed Care – PPO | Admitting: Neurology

## 2014-02-08 DIAGNOSIS — R29898 Other symptoms and signs involving the musculoskeletal system: Secondary | ICD-10-CM

## 2014-02-08 NOTE — Procedures (Signed)
[facility]  [address], [address]  [place], [place] [address] Tel: [phone] Fax:  [phone] Test Date:  02/08/2014  Patient: Daniel PullerDouglas Rush DOB: [birth year] Physician: Nita Sickleonika Savan Ruta, DO  Sex: Male Height: 5\' 8"  Ref Phys: Johny BlamerHarris, William  ID#: [id number]   Technician:    Patient Complaints: This is a 58 year-old gentleman with history of cervical foraminectomy presenting with subacute onset of right hand weakness and tingling.  NCV & EMG Findings: Extensive electrodiagnostic testing of the right upper extremity and additional studies of the left reveals:  1. Normal median, ulnar, and radial sensory responses bilaterally.  2. Evaluation of the right median motor nerve showed prolonged distal onset latency (4.1 ms) and reduced amplitude (3.1 mV).  The right ulnar motor nerve showed reduced amplitude (5.8 mV) with normal latency and conduction velocity.  The left median and ulnar motor responses are within normal limits.  3. F Wave studies indicate that the right ulnar F wave has mildly prolonged latency (33.30 ms).   4. Needle electrode examination shows moderate chronic motor axon loss changes affecting nearly all of the muscles tested involving C5-T1 myotomes.  There is no evidence of active motor axon loss. Similar findings are not present on the left upper extremity.   Impression: 1. 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  These findings are moderately severe in degree electrically.  2. Alternatively, a motor polyradiculoneuropathy cannot be excluded given the demyelinating and axon loss changes affecting the median and ulnar motor nerves.   3. There is no evidence of a sensory polyneuropathy or brachial plexopathy affecting upper extremities.   ___________________________ [name], DO    Nerve Conduction Studies Anti Sensory Summary Table   Site NR Peak (ms) Norm Peak (ms) P-T Amp (V) Norm P-T Amp  Left Median Anti Sensory (2nd Digit)  Wrist    3.3 <3.6 30.9 >15  Right Median Anti Sensory (2nd Digit)  Wrist    3.2 <3.6 21.8 >15  Left Radial Anti Sensory (Base 1st Digit)  Wrist    2.4 <2.7 15.0 >14  Right Radial Anti Sensory (Base 1st Digit)  Wrist    2.3 <2.7 17.6 >14  Left Ulnar Anti Sensory (5th Digit)  Wrist    3.1 <3.1 12.1 >10  Right Ulnar Anti Sensory (5th Digit)  Wrist    2.5 <3.1 18.1 >10   Motor Summary Table   Site NR Onset (ms) Norm Onset (ms) O-P Amp (mV) Norm O-P Amp Site1 Site2 Delta-0 (ms) Dist (cm) Vel (m/s) Norm Vel (m/s)  Left Median Motor (Abd Poll Brev)  Wrist    3.4 <4.0 6.5 >6 Elbow Wrist 5.7 29.0 51 >50  Elbow    9.1  6.1         Right Median Motor (Abd Poll Brev)  Wrist    4.1 <4.0 3.1 >6 Elbow Wrist 5.9 30.0 51 >50  Elbow    10.0  3.0  Post-exercise Elbow 5.6 0.0    Post-exercise    4.4  3.6         Left Ulnar Motor (Abd Dig Minimi)  Wrist    2.3 <3.1 8.3 >7 B Elbow Wrist 4.3 26.0 60 >50  B Elbow    6.6  8.1  A Elbow B Elbow 1.9 10.0 53 >50  A Elbow    8.5  7.6         Right  Ulnar Motor (Abd Dig Minimi)  Wrist    2.0 <3.1 5.8 >7 B Elbow Wrist 4.6 24.0 52 >50  B Elbow    6.6  5.6  A Elbow B Elbow 1.7 10.0 59 >50  A Elbow    8.3  5.2          Comparison Summary Table   Site NR Peak (ms) Norm Peak (ms) P-T Amp (V) Site1 Site2 Delta-P (ms) Norm Delta (ms)  Right Median/Ulnar Palm Comparison (Wrist - 8cm)  Median Palm    1.9 <2.2 60.4 Median Palm Ulnar Palm 0.1   Ulnar Palm    1.8 <2.2 14.8       F Wave Studies   NR F-Lat (ms) Lat Norm (ms) L-R F-Lat (ms)  Left Ulnar (Mrkrs) (Abd Dig Min)     32.47 <33 0.83  Right Ulnar (Mrkrs) (Abd Dig Min)     33.30 <33 0.83   EMG   Side Muscle Ins Act Fibs Psw Fasc Number Recrt Dur Dur. Amp Amp. Poly Poly. Comment  Right 1stDorInt Nml Nml Nml Nml 2- Rapid Some 1+ Some 1+ Few 1+ N/A  Right Abd Poll Brev Nml Nml Nml Nml 2- Rapid Many 1+ Many 1+  Nml Nml N/A  Right FlexPolLong Nml Nml Nml Nml Nml Nml Nml Nml Nml Nml Nml Nml N/A  Right Ext Indicis Nml Nml Nml Nml 1- Mod-R Few 1+ Few 1+ Nml Nml N/A  Right PronatorTeres Nml Nml Nml Nml 1- Rapid Some 1+ Some 1+ Few 1+ N/A  Right Biceps Nml Nml Nml Nml 2- Rapid Many 1+ Many 1+ Some 1+ N/A  Right Triceps Nml Nml Nml 1+ 2- Rapid Many 1+ Many 1+ Nml Nml N/A  Right Deltoid Nml Nml Nml Nml 2- Rapid Many 1+ Many 1+ Some 1+ N/A  Right Infraspinatus Nml Nml Nml Nml 1- Mod-R Few 1+ Few 1+ Nml Nml N/A  Left 1stDorInt Nml Nml Nml Nml Nml Nml Nml Nml Nml Nml Nml Nml N/A  Left Abd Poll Brev Nml Nml Nml Nml Nml Nml Nml Nml Nml Nml Nml Nml N/A  Left PronatorTeres Nml Nml Nml Nml Nml Nml Nml Nml Nml Nml Nml Nml N/A  Left Biceps Nml Nml Nml Nml Nml Nml Nml Nml Nml Nml Nml Nml N/A  Left Triceps Nml Nml Nml Nml 1- Mod-R Some 1+ Some 1+ Nml Nml N/A  Left FlexPolLong Nml Nml Nml Nml Nml Nml Nml Nml Nml Nml Nml Nml N/A  Left Deltoid Nml Nml Nml Nml Nml Nml Nml Nml Nml Nml Nml Nml N/A     Waveforms:

## 2014-02-10 ENCOUNTER — Telehealth: Payer: Self-pay | Admitting: Neurology

## 2014-02-10 NOTE — Telephone Encounter (Signed)
Pt's daughter called. Says pt received message to call, she is unsure who called, but he is out of the [place]. He can be reached by email at [email or web address] / Sherri S.

## 2014-02-13 ENCOUNTER — Telehealth: Payer: Self-pay | Admitting: Neurology

## 2014-02-13 NOTE — Telephone Encounter (Signed)
Did you call this patient?

## 2014-02-13 NOTE — Telephone Encounter (Signed)
I have sent a email and it came back stating that it could not be delivered. I will see if Sherri can ge tone to go out to him thanks

## 2014-02-13 NOTE — Telephone Encounter (Signed)
I called patient and left messages on 02-09-14 02-10-14 and 02-13-14 to see about him coming in on 02-14-14 and got no answer due to the fact he is out of the country and we have emailed him to see when he is coming back to the [place] we will make appt to see him when he gets back thanks

## 2014-02-13 NOTE — Telephone Encounter (Signed)
No, but I did request that he see me in the clinic as a new consult.  I performed the EMG and discussed with his PCP that he should come back as a consult visit.  Please find out when patient returns to [place] and schedule appointment.    Thanks,  Tamyrah Burbage K. Allena KatzPatel, DO

## 2014-03-03 ENCOUNTER — Encounter: Payer: Self-pay | Admitting: Neurology

## 2014-03-03 ENCOUNTER — Ambulatory Visit (INDEPENDENT_AMBULATORY_CARE_PROVIDER_SITE_OTHER): Payer: BC Managed Care – PPO | Admitting: Neurology

## 2014-03-03 VITALS — BP 130/88 | HR 58 | Ht 68.11 in | Wt 159.1 lb

## 2014-03-03 DIAGNOSIS — M5412 Radiculopathy, cervical region: Secondary | ICD-10-CM

## 2014-03-03 DIAGNOSIS — M5010 Cervical disc disorder with radiculopathy, unspecified cervical region: Secondary | ICD-10-CM

## 2014-03-03 DIAGNOSIS — G959 Disease of spinal cord, unspecified: Secondary | ICD-10-CM

## 2014-03-03 DIAGNOSIS — R29898 Other symptoms and signs involving the musculoskeletal system: Secondary | ICD-10-CM

## 2014-03-03 LAB — CREATININE, SERUM: Creat: 1.05 mg/dL (ref 0.50–1.35)

## 2014-03-03 LAB — CK: Total CK: 208 U/L (ref 7–232)

## 2014-03-03 LAB — C-REACTIVE PROTEIN: CRP: <0.5 mg/dL (ref <0.60)

## 2014-03-03 NOTE — Patient Instructions (Signed)
1.  Check blood work 2.  MRI cervical wwo contrast 3.  EMG of the legs  4.  Encouraged to start water exercises to strength right hip flexion and extension 5.  Return to clinic 6 weeks, or sooner as needed

## 2014-03-03 NOTE — Progress Notes (Signed)
Daniel Rush   Date: 03/06/2014  Daniel Rush MRN: [id number] DOB: 1955-08-13   Dear Dr. Kenton Kingfisher:  Thank you for your kind referral of Daniel Rush for consultation of right hand weakness. Although her history is well known to you, please allow [place] to reiterate it for the purpose of our medical record. The patient was accompanied to the clinic by self.    History of Present Illness: Daniel Rush is a 58 y.o. 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    In 1999, he had injured his cervical spine and developed ruptured disc at C4-5 and underwent surgery.  He had severe pain and right arm weakness, limiting him from him from even lifting to feed himself. Following surgery, he eventually recovered and pain subsided. He has always had residual weakness of proximal right arm strength.  Despite this, he is very active and goes to the gym daily, teaches martial arts  (x 20 years).   Around April 2015, he woke up and suddenly noticed 30-40% reduction in right hand grip strength. He goes to the gym daily and that morning, he was doing pull-ups and noticed that he was unable to hold dumbbell or lift with the right hand has he was able to do the day previously.  He did not have any associated shoulder, neck pain, or numbness/tingling.   Symptoms remained unchanged for 2 weeks and then noticed some improvement.  He continued to workout and has noticed that strength has slowly improved, but remains about 15% away from what he was lifting prior to April.  He feels that stregnth has platueaed since late June.     He went to see his PCP for routine evaluation and had mentioned about his hand weakness.  EMG was ordered which showed multilevel  intraspinal canal lesion affecting C5-T1 nerve root/segments, moderately severe in degree electrically.  There was demyelinating changes seen affecting the median and ulnar motor nerves, which is of unclear significance.  Patient here for neurological evaluation.     Past Medical History  Diagnosis Date  . Asthma   . Hypertension     Past Surgical History  Procedure Laterality Date  . Neck surgery      C4-C5  . Knee surgery      Medications:  Current Outpatient Prescriptions on File Prior to Rush  Medication Sig Dispense Refill  . Cyanocobalamin (VITAMIN B-12 PO) Take 1 capsule by mouth daily.      Marland Kitchen LECITHIN PO Take 1 capsule by mouth daily.      Marland Kitchen losartan-hydrochlorothiazide (HYZAAR) 50-12.5 MG per tablet Take 1 tablet by mouth daily.      . Multiple Vitamins-Minerals (MULTIVITAMIN WITH MINERALS) tablet Take 1 tablet by mouth daily.      . ST JOHNS WORT PO Take 1 tablet by mouth.      . testosterone (ANDROGEL) 50 MG/5GM GEL Place 5 g onto the skin daily.      Marland Kitchen VITAMIN E PO Take 1 capsule by mouth.       No current facility-administered medications on file prior to Rush.    Allergies:  Allergies  Allergen Reactions  . Latex Rash    Family History: Family History  Problem Relation Age of Onset  . Alzheimer's disease Mother     Deceased, 22  .  Hypertension Mother   . Heart failure Father     Deceased, 77  . Hypertension Father   . Hyperlipidemia Brother   . Healthy Daughter   . Healthy Son     Social History: History   Social History  . Marital Status: Married    Spouse Name: N/A    Number of Children: N/A  . Years of Education: N/A   Occupational History  . Not on file.   Social History Main Topics  . Smoking status: Never Smoker   . Smokeless tobacco: Not on file  . Alcohol Use: 1.2 oz/week    2 Glasses of wine per week     Comment: daily with dinner 2 glasses of wine  . Drug Use: No  . Sexual Activity: Yes   Other Topics Concern  . Not on  file   Social History Narrative   He works in online marketing (e-commerce).   Lives with wife. They have two children.   Highest level of education:  college    Review of Systems:  CONSTITUTIONAL: No fevers, chills, night sweats, or weight loss.   EYES: No visual changes or eye pain ENT: No hearing changes.  No history of nose bleeds.   RESPIRATORY: No cough, wheezing and shortness of breath.   CARDIOVASCULAR: Negative for chest pain, and palpitations.   GI: Negative for abdominal discomfort, blood in stools or black stools.  No recent change in bowel habits.  GU:  No history of incontinence.   MUSCLOSKELETAL: No history of joint pain or swelling.  No myalgias.   SKIN: Negative for lesions, rash, and itching.   HEMATOLOGY/ONCOLOGY: Negative for prolonged bleeding, bruising easily, and swollen nodes.  No history of cancer.   ENDOCRINE: Negative for cold or heat intolerance, polydipsia or goiter.   PSYCH:  No depression or anxiety symptoms.   NEURO: As Above.   Vital Signs:  BP 130/88  Pulse 58  Ht 5' 8.11" (1.73 m)  Wt 159 lb 2 oz (72.179 kg)  BMI 24.12 kg/m2  SpO2 97%   General Medical Exam:   General:  Well appearing, comfortable.   Eyes/ENT: see cranial nerve examination.   Neck: No masses appreciated.  Full range of motion without tenderness.  No carotid bruits. Respiratory:  Clear to auscultation, good air entry bilaterally.   Cardiac:  Regular rate and rhythm, no murmur.    Extremities:  No deformities, edema, or skin discoloration. Good capillary refill.   Skin:  Skin color, texture, turgor normal. No rashes or lesions.  Neurological Exam: MENTAL STATUS including orientation to time, place, person, recent and remote memory, attention span and concentration, language, and fund of knowledge is normal.  Speech is not dysarthric.  CRANIAL NERVES: II:  No visual field defects.  Unremarkable fundi.   III-IV-VI: Pupils equal round and reactive to light.  Normal  conjugate, extra-ocular eye movements in all directions of gaze.  No nystagmus.  No ptosis  V:  Normal facial sensation.     VII:  Normal facial symmetry and movements.  No pathologic facial reflexes.  VIII:  Normal hearing and vestibular function.   IX-X:  Normal palatal movement.   XI:  Normal shoulder shrug and head rotation.   XII:  Normal tongue strength and range of motion, no deviation or fasciculation.  MOTOR:  Muscle bulk is preserved throughout, however in comparison to left upper extremity, right arm is smaller and slightly atrophied especially in the forearm.  No fasciculations or abnormal movements.    No pronator drift.  Tone is normal.    Right Upper Extremity:    Left Upper Extremity:    Deltoid  4+/5   Deltoid  5/5   Biceps  4+/5   Biceps  5/5   Triceps  4+/5   Triceps  5/5   Wrist extensors  5-/5   Wrist extensors  5/5   Wrist flexors  5-/5   Wrist flexors  5/5   Finger extensors  5-/5   Finger extensors  5/5   Finger flexors  5-/5   Finger flexors  5/5   Dorsal interossei  4+/5   Dorsal interossei  5/5   Abductor pollicis  4+/5   Abductor pollicis  5/5   Tone (Ashworth scale)  0  Tone (Ashworth scale)  0   Right Lower Extremity:    Left Lower Extremity:    Hip flexors  4+/5   Hip flexors  5/5   Hip extensors  4+/5   Hip extensors  5/5   Abductor 5/5  Abductor 5/5  Adductor 4+/5  Adductor 5/5  Knee flexors  5/5   Knee flexors  5/5   Knee extensors  5/5   Knee extensors  5/5   Dorsiflexors  5/5   Dorsiflexors  5/5   Plantarflexors  5/5   Plantarflexors  5/5   Toe extensors  5/5   Toe extensors  5/5   Toe flexors  5/5   Toe flexors  5/5   Tone (Ashworth scale)  0  Tone (Ashworth scale)  0   MSRs:  Right                                                                 Left brachioradialis 1+  brachioradialis 2+  biceps 1+  biceps 2+  triceps 1+  triceps 2+  patellar 2+  patellar 3+  ankle jerk trace  ankle jerk 2+  Hoffman no  Hoffman no  plantar response down   plantar response down   SENSORY:  Normal and symmetric perception of light touch, pinprick, vibration, and proprioception.  Romberg's sign absent.   COORDINATION/GAIT: Normal finger-to- nose-finger and heel-to-shin.  Intact rapid alternating movements bilaterally.  Able to rise from a chair without using arms.  Gait narrow based and stable. Tandem and stressed gait intact.   Data: EMG bilateral upper extremities  1. 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 These findings are moderately severe in degree electrically.  2. Alternatively, a motor polyradiculoneuropathy cannot be excluded given the demyelinating and axon loss changes affecting the median and ulnar motor nerves.  3. There is no evidence of a sensory polyneuropathy or brachial plexopathy affecting upper extremities.  CT cervical spine wo contrast 11/08/2010: 1. Reversal of the normal cervical lordosis with 2 mm of retrolisthesis of C3 on C4, C4 on C5 and C6 on C7.  2. The predental space is within normal limits on CT scanning, probably related to supine positioning in this patient with instability demonstrated on radiographs.  3. Multilevel cervical spondylosis with bilateral foraminal stenosis at multiple levels as described above.  4. Mild central canal narrowing at the time of CT scanning of the atlantoaxial junction. Based on   plain films, this likely underestimates a dynamic stenosis.  Lab Results  Component Value Date   HGBA1C 5.3 01/31/2013   Lab Results  Component Value Date   TSH 2.301 01/31/2013     IMPRESSION: Daniel Rush is a 57 year-old gentleman presenting for evaluation of relatively abrupt onset of painless right hand weakness.  Exam shows asymmetrical right proximal and distal arm weakness and proximal leg weakness.  He was unaware that his leg were even weak until the assessment today.  Reflexes are reduced  on the left side.  Sensation is preserved throughout.  Based on his EMG of the arms and imaging, he has multilevel foraminal disease, however with the subtle demyelinating features and atypical history, it is prudent to evaluate for polyradiculoneuropathy.  I will check EMG of the legs to see if there is similar findings and also obtain imaging of the cervical spine to look for nerve root enhancement as well as structural pathology.  Going forward, CSF testing and/or GM1 antibody may be checked.   Although he has lateralizing findings, stroke is thought to be less likely based on his neurogenic changes on his EMG.   PLAN/RECOMMENDATIONS:  1.  MRI cervical spine wwo contrast 2.  Check ESR, CRP, vitamin B12, copper, 2-hr glucose tolerance test, TSH, ANA, ENA, CK 3.  EMG of the legs, right > left 4.  Return to clinic in 1 months.   The duration of this appointment Rush was 65 minutes of face-to-face time with the patient.  Greater than 50% of this time was spent in counseling, explanation of diagnosis, planning of further management, and coordination of care.   Thank you for allowing me to participate in patient's care.  If I can answer any additional questions, I would be pleased to do so.    Sincerely,    Donika K. Patel, DO   

## 2014-03-04 LAB — SEDIMENTATION RATE: Sed Rate: 1 mm/hr (ref 0–16)

## 2014-03-04 LAB — VITAMIN B12: Vitamin B-12: 1337 pg/mL — ABNORMAL HIGH (ref 211–911)

## 2014-03-04 LAB — TSH: TSH: 1.754 u[IU]/mL (ref 0.350–4.500)

## 2014-03-05 LAB — COPPER, SERUM: Copper: 102 ug/dL (ref 70–175)

## 2014-03-06 LAB — ENA 9 PANEL
Centromere Ab Screen: <1
ENA SM Ab Ser-aCnc: <1
Jo-1 Antibody, IgG: <1
Ribosomal P Protein Ab: <1
SM/RNP: <1
SSA (Ro) (ENA) Antibody, IgG: <1
SSB (La) (ENA) Antibody, IgG: <1
Scleroderma (Scl-70) (ENA) Antibody, IgG: <1
ds DNA Ab: 1 IU/mL

## 2014-03-06 LAB — ANA: Anti Nuclear Antibody(ANA): NEGATIVE

## 2014-03-06 NOTE — Progress Notes (Signed)
Note faxed.

## 2014-03-07 ENCOUNTER — Other Ambulatory Visit: Payer: BC Managed Care – PPO

## 2014-03-07 DIAGNOSIS — R29898 Other symptoms and signs involving the musculoskeletal system: Secondary | ICD-10-CM

## 2014-03-07 DIAGNOSIS — G959 Disease of spinal cord, unspecified: Secondary | ICD-10-CM

## 2014-03-07 DIAGNOSIS — M5010 Cervical disc disorder with radiculopathy, unspecified cervical region: Secondary | ICD-10-CM

## 2014-03-07 LAB — GLUCOSE TOLERANCE, 2 HOURS
Glucose, 1 Hour GTT: 177 mg/dL
Glucose, 2 hour: 114 mg/dL
Glucose, Fasting: 86 mg/dL (ref 70–99)

## 2014-03-08 ENCOUNTER — Ambulatory Visit
Admission: RE | Admit: 2014-03-08 | Discharge: 2014-03-08 | Disposition: A | Discharge status: Home / Self Care | Payer: BC Managed Care – PPO | Source: Ambulatory Visit | Attending: Neurology | Admitting: Neurology

## 2014-03-08 ENCOUNTER — Ambulatory Visit (INDEPENDENT_AMBULATORY_CARE_PROVIDER_SITE_OTHER): Payer: BC Managed Care – PPO | Admitting: Neurology

## 2014-03-08 DIAGNOSIS — R29898 Other symptoms and signs involving the musculoskeletal system: Secondary | ICD-10-CM

## 2014-03-08 DIAGNOSIS — G959 Disease of spinal cord, unspecified: Secondary | ICD-10-CM

## 2014-03-08 DIAGNOSIS — M5010 Cervical disc disorder with radiculopathy, unspecified cervical region: Secondary | ICD-10-CM

## 2014-03-08 DIAGNOSIS — M5412 Radiculopathy, cervical region: Secondary | ICD-10-CM

## 2014-03-08 NOTE — Procedures (Signed)
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  NCV & EMG Findings: Extensive electrodiagnostic testing of the right lower extremity and additional studies of the left reveals:  1. Bilateral sural and superficial peroneal sensory responses are within normal limits. 2. Right peroneal motor nerve shows reduced amplitude (1.2 mV) recording at the extensor digitorum brevis, however it is within normal limits when recording at the tibialis anterior. Bilateral tibial motor within normal limits. 3. Late responses including the right tibial and bilateral H-reflexes are borderline prolonged period. 4. Chronic motor axon loss changes are seen affecting the right L3-L5 myotomes, without accompanied active denervation. Similar changes are seen to a lesser degree involving the left L5 myotome.   Impression: 1. The electrophysiologic findings are most consistent with a chronic intraspinal canal lesion (i.e. radiculopathy) affecting the right L3-L4 nerve roots/segments, mild to moderate in degree electrically. 2. Chronic L5 radiculopathy affecting bilateral lower extremities, worse on the right. Overall, these findings are mild in degree electrically.  3. There is no evidence of a generalized sensorimotor peripheral neuropathy. Although late responses are borderline prolonged, a lumbosacral polyradiculoneuropathy is thought to be less likely. Clinical correlation recommended.   ___________________________ Nita Sickle, DO    Nerve Conduction Studies Anti Sensory Summary Table   Site NR Peak (ms)  Norm Peak (ms) P-T Amp (V) Norm P-T Amp  Left Sup Peroneal Anti Sensory (Ant Lat Mall)  12 cm    3.1 <4.6 7.4 >4  Right Sup Peroneal Anti Sensory (Ant Lat Mall)  12 cm    3.0 <4.6 8.6 >4  Left Sural Anti Sensory (Lat Mall)  Calf    4.0 <4.6 7.0 >4  Right Sural Anti Sensory (Lat Mall)  Calf    3.4 <4.6 6.2 >4   Motor Summary Table   Site NR Onset (ms) Norm Onset (ms) O-P Amp (mV) Norm O-P Amp Site1 Site2 Delta-0 (ms) Dist (cm) Vel (m/s) Norm Vel (m/s)  Left Peroneal Motor (Ext Dig Brev)  Ankle    4.8 <6.0 4.8 >2.5 B Fib Ankle 8.0 32.0 40 >40  B Fib    12.8  3.9  Poplt B Fib 2.0 10.0 50 >40  Poplt    14.8  3.8         Right Peroneal Motor (Ext Dig Brev)  Ankle    4.0 <6.0 1.2 >2.5 B Fib Ankle 8.2 33.0 40 >40  B Fib    12.2  0.9  Poplt B Fib 2.4 10.0 42 >40  Poplt    14.6  0.7         Left Peroneal TA Motor (Tib Ant)  Fib Head    2.5 <4.5 6.0 >3 Poplit Fib Head 1.8 10.0 56 >40  Poplit    4.3  5.7         Right Peroneal TA Motor (Tib Ant)  Fib Head    2.9 <4.5 6.7 >3 Poplit Fib Head 1.8 10.0 56 >40  Poplit    4.7  6.2         Left Tibial Motor (Abd Hall Brev)  Ankle    5.9 <6.0 9.1 >  4 Knee Ankle 9.2 38.0 41 >40  Knee    15.1  6.7         Right Tibial Motor (Abd Hall Brev)  Ankle    4.4 <6.0 9.3 >4 Knee Ankle 9.5 38.0 40 >40  Knee    13.9  6.6          F Wave Studies   NR F-Lat (ms) Lat Norm (ms) L-R F-Lat (ms)  Left Tibial (Mrkrs) (Abd Hallucis)     53.99 <55 2.08  Right Tibial (Mrkrs) (Abd Hallucis)     56.07 <55 2.08   H Reflex Studies   NR H-Lat (ms) Lat Norm (ms) L-R H-Lat (ms)  Left Tibial (Gastroc)     36.60 <35 1.50  Right Tibial (Gastroc)     35.10 <35 1.50   EMG   Side Muscle Ins Act Fibs Psw Fasc Number Recrt Dur Dur. Amp Amp. Poly Poly. Comment  Right AntTibialis Nml Nml Nml Nml 1- Mod Few 1+ Nml Nml Nml Nml N/A  Right Flex Dig Long Nml Nml Nml Nml 1- Mod-R Some 1+ Nml Nml Nml Nml N/A  Right Gastroc Nml Nml Nml Nml 2- Mod-V Nml Nml Nml Nml Nml Nml N/A    Right RectFemoris Nml Nml Nml Nml 1- Mod-R Some 1+ Nml Nml Nml Nml N/A  Right GluteusMed Nml Nml Nml Nml 1- Mod-R Some 1+ Nml Nml Nml Nml N/A  Right BicepsFemS Nml Nml Nml Nml Nml Nml Nml Nml Nml Nml Nml Nml N/A  Right Lumbo Parasp Low Nml Nml Nml Nml Nml Nml Nml Nml Nml Nml Nml Nml N/A  Left AntTibialis Nml Nml Nml Nml 1- Mod Few 1+ Nml Nml Nml Nml N/A  Left Gastroc Nml Nml Nml Nml Nml Nml Nml Nml Nml Nml Nml Nml N/A  Left RectFemoris Nml Nml Nml Nml Nml Nml Nml Nml Nml Nml Nml Nml N/A  Left Flex Dig Long Nml Nml Nml Nml 1- Mod-V Few 1+ Nml Nml Nml Nml N/A  Left BicepsFemS Nml Nml Nml Nml Nml Nml Nml Nml Nml Nml Nml Nml N/A      Waveforms:

## 2014-03-15 ENCOUNTER — Ambulatory Visit
Admission: RE | Admit: 2014-03-15 | Discharge: 2014-03-15 | Disposition: A | Discharge status: Home / Self Care | Payer: BC Managed Care – PPO | Source: Ambulatory Visit | Attending: Neurology | Admitting: Neurology

## 2014-03-15 DIAGNOSIS — M5010 Cervical disc disorder with radiculopathy, unspecified cervical region: Secondary | ICD-10-CM

## 2014-03-15 DIAGNOSIS — R29898 Other symptoms and signs involving the musculoskeletal system: Secondary | ICD-10-CM

## 2014-03-15 DIAGNOSIS — G959 Disease of spinal cord, unspecified: Secondary | ICD-10-CM

## 2014-03-15 MED ORDER — GADOBENATE DIMEGLUMINE 529 MG/ML IV SOLN
14.0000 mL | Freq: Once | INTRAVENOUS | Status: AC | PRN
  Administered 2014-03-15: 14 mL via INTRAVENOUS

## 2014-03-16 ENCOUNTER — Telehealth: Payer: Self-pay | Admitting: Neurology

## 2014-03-16 NOTE — Telephone Encounter (Signed)
He is moved and slot is held thanks

## 2014-03-16 NOTE — Telephone Encounter (Signed)
Called and discuss results of her lumbar MRI which shows mild sedative changes and noncompressive disc bulges. At this point I would like to follow the patient clinically her symptoms are most likely due to radiculopathy and less likely a polyradiculoneuropathy, however if there is any worsening of symptoms, low threshold for CSF testing. I will see the patient back in 2 months.  Latoshia Monrroy K. Allena Katz, DO

## 2014-04-14 ENCOUNTER — Ambulatory Visit: Payer: BC Managed Care – PPO | Admitting: Neurology

## 2014-05-25 ENCOUNTER — Encounter: Payer: Self-pay | Admitting: Neurology

## 2014-05-25 ENCOUNTER — Ambulatory Visit (INDEPENDENT_AMBULATORY_CARE_PROVIDER_SITE_OTHER): Payer: BC Managed Care – PPO | Admitting: Neurology

## 2014-05-25 ENCOUNTER — Encounter: Payer: BC Managed Care – PPO | Admitting: Neurology

## 2014-05-25 VITALS — BP 144/88 | HR 58 | Ht 69.0 in | Wt 156.2 lb

## 2014-05-25 DIAGNOSIS — M5010 Cervical disc disorder with radiculopathy, unspecified cervical region: Secondary | ICD-10-CM

## 2014-05-25 MED ORDER — PREDNISONE 10 MG PO TABS: ORAL_TABLET | ORAL | Status: DC

## 2014-05-25 MED ORDER — GABAPENTIN 100 MG PO CAPS: 300.0000 mg | ORAL_CAPSULE | Freq: Every day | ORAL | Status: AC

## 2014-05-25 NOTE — Progress Notes (Signed)
Follow-up Visit   Date: 05/25/2014    Daniel Rush MRN: [id number] DOB: 1955-12-20   Interim History: Daniel Rush is a 58 y.o. 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    History of present illness: In 1999, he had injured his cervical spine and developed ruptured disc at C4-5 and underwent surgery. He had severe pain and right arm weakness, limiting him from him from even lifting to feed himself. Following surgery, he eventually recovered and pain subsided. He has always had residual weakness of proximal right arm strength. Despite this, he is very active and goes to the gym daily (2 hours), teaches martial arts (x 20 years).   Around April 2015, he woke up and suddenly noticed 30-40% reduction in right hand grip strength. He goes to the gym daily and that morning, he was doing pull-ups and noticed that he was unable to hold dumbbell or lift with the right hand has he was able to do the day previously. He did not have any associated shoulder, neck pain, or numbness/tingling.  Symptoms remained unchanged for 2 weeks and then noticed some improvement. He continued to workout and has noticed that strength has slowly improved, but remains about 15% away from what he was lifting prior to April. He feels that stregnth has platueaed since late June.   He went to see his PCP for routine evaluation and had mentioned about his hand weakness. EMG was ordered which showed multilevel intraspinal canal lesion affecting C5-T1 nerve root/segments, moderately severe in degree electrically. There was demyelinating changes seen affecting the median and ulnar motor nerves, which is of unclear significance.    UPDATE 05/25/2014:  At his last visit, EMG of the legs  was performed which showed L3-4 radiculopathy and bilateral L5 radiculopathy.  Imaging of the cervical spine showed multilevel disc degeneration with severe foraminal encroachment at involving all levels from C4-T2 on the right.  Lumbar spine shows multilevel disc bulges involving the lumbar spine with mild lateral recess stenosis without nerve impingement.   He reports star-gazing last week and developed stabbing lower neck pain between his rhomboids, which is constant and worse with neck flexion.  He has tried tylenol/ibuprofen which does not help.  Heat and TENs units provides temporarily relief.  Regarding his right and leg hand weakness, he reports strength is almost back to baseline and is able to lift nearly as much as he was back in April 2015 (5% variation).     Medications:  Current Outpatient Prescriptions on File Prior to Visit  Medication Sig Dispense Refill  . Cyanocobalamin (VITAMIN B-12 PO) Take 1 capsule by mouth daily.    Marland Kitchen LECITHIN PO Take 1 capsule by mouth daily.    Marland Kitchen losartan-hydrochlorothiazide (HYZAAR) 50-12.5 MG per tablet Take 1 tablet by mouth daily.    . Multiple Vitamins-Minerals (MULTIVITAMIN WITH MINERALS) tablet Take 1 tablet by mouth daily.    . ST JOHNS WORT PO Take 1 tablet by mouth.    . testosterone (ANDROGEL) 50 MG/5GM GEL Place 5 g onto the skin daily.    Marland Kitchen VITAMIN E PO Take 1 capsule by mouth.     No current facility-administered medications on file prior to visit.    Allergies:  Allergies  Allergen Reactions  . Latex Rash     Review  of Systems:  CONSTITUTIONAL: No fevers, chills, night sweats, or weight loss.   EYES: No visual changes or eye pain ENT: No hearing changes.  No history of nose bleeds.   RESPIRATORY: No cough, wheezing and shortness of breath.   CARDIOVASCULAR: Negative for chest pain, and palpitations.   GI: Negative for abdominal discomfort, blood in stools or black stools.  No recent change in bowel habits.   GU:  No history of  incontinence.   MUSCLOSKELETAL: No history of joint pain or swelling.  No myalgias.  + neck pain SKIN: Negative for lesions, rash, and itching.   ENDOCRINE: Negative for cold or heat intolerance, polydipsia or goiter.   PSYCH:  No depression or anxiety symptoms.   NEURO: As Above.   Vital Signs:  BP 144/88 mmHg  Pulse 58  Ht 5' 9"  (1.753 m)  Wt 156 lb 4 oz (70.875 kg)  BMI 23.06 kg/m2  SpO2 98%  Neurological Exam: MENTAL STATUS including orientation to time, place, person, recent and remote memory is normal.  Speech is not dysarthric.  CRANIAL NERVES:  Face is symmetric.   MOTOR:  Asymmetrical loss of muscle bulk on the right upper extremity, compared to the left although he is a well-built and muscle gentleman at baseline.  No fasciculations or abnormal movements.  No pronator drift.  Tone is normal.  There is increased muscle tenderness over the right cervical muscles.  Right Upper Extremity:    Left Upper Extremity:    Deltoid  5/5   Deltoid  5/5   Biceps  5/5   Biceps  5/5   Triceps  5/5   Triceps  5/5   Wrist extensors  5/5   Wrist extensors  5/5   Wrist flexors  5/5   Wrist flexors  5/5   Finger extensors  5/5   Finger extensors  5/5   Finger flexors  5/5   Finger flexors  5/5   Dorsal interossei  5/5   Dorsal interossei  5/5   Abductor pollicis  5/5   Abductor pollicis  5/5   Tone (Ashworth scale)  0  Tone (Ashworth scale)  0   Right Lower Extremity:    Left Lower Extremity:    Hip flexors  5/5   Hip flexors  5/5   Hip extensors  5/5   Hip extensors  5/5   Knee flexors  5/5   Knee flexors  5/5   Knee extensors  5/5   Knee extensors  5/5   Dorsiflexors  5/5   Dorsiflexors  5/5   Plantarflexors  5/5   Plantarflexors  5/5   Toe extensors  5/5   Toe extensors  5/5   Toe flexors  5/5   Toe flexors  5/5   Tone (Ashworth scale)  0  Tone (Ashworth scale)  0    MSRs:  Right                                                                 Left brachioradialis 1+   brachioradialis 2+  biceps 1+  biceps 2+  triceps 1+  triceps 2+  patellar 2+  patellar 3+  ankle jerk trace  ankle jerk 2+  Hoffman no  Hoffman no  plantar response down  plantar response down  Left crossed adductors.  Plantar responses are flexor.   SENSORY:  Intact to vibration.  COORDINATION/GAIT:  Gait narrow based and stable.   Data: EMG right > left arm 02/08/2014: 1. 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 These findings are moderately severe in degree electrically.  2. Alternatively, a motor polyradiculoneuropathy cannot be excluded given the demyelinating and axon loss changes affecting the median and ulnar motor nerves.  3. There is no evidence of a sensory polyneuropathy or brachial plexopathy affecting upper extremities.  EMG Right > left leg 03/08/2014: 4. The electrophysiologic findings are most consistent with a chronic intraspinal canal lesion (i.e. radiculopathy) affecting the right L3-L4 nerve roots/segments, mild to moderate in degree electrically. 5. Chronic L5 radiculopathy affecting bilateral lower extremities, worse on the right. Overall, these findings are mild in degree electrically.  6. There is no evidence of a generalized sensorimotor peripheral neuropathy. Although late responses are borderline prolonged, a lumbosacral polyradiculoneuropathy is thought to be less likely. Clinical correlation recommended.  MRI cervical spine wwo contrast 03/09/2014:  Cervical kyphosis. Multilevel disc degeneration and spondylosis similar to 2010 with multilevel spinal stenosis and foraminal encroachment. Foraminal encroachment is most severe on the right at multiple levels including C4-5, C5-6, C6-7, C7-T1, and T1-T2.  MRI lumbar spine wwo contrast 03/15/2014: Non-compressive disc bulges at L1-2, L2-3 and L3-4.  Disc bulge at L4-5 more prominent in the left  posterior lateral direction. Mild facet and ligamentous hypertrophy. Mild stenosis of the lateral recesses, left more than right. This could possibly be symptomatic, but definite neural compression is not established.  Chronic disc degeneration at L5-S1 with loss of disc height and chronic endplate marrow changes. Mild narrowing of the subarticular lateral recesses and neural foramina without apparent neural compression.  Labs 03/03/2014:  TSH 1.7, ANA neg, ENA neg, CK 208, ESR 1, CRP <0.5, vitamin B12 1337, copper 102  IMPRESSION/PLAN: 1.  Severe cervical spinal and foraminal stenosis with baseline right arm weakness/loss of muscle bulk compared to left  - His right sided weakness has significantly improved, but today he is having radicular neck pain  - Start prednisone taper 74m over one week   - Start gabapentin 1058mqhs and uptitrate to 30063mhs  - He is also seeking surgical consultation.  He has previously seen Dr. DavSherley Boundsd would like to seek his opinion again 2.  Lumbar disc herniation, clinically asymptomatic 3.  Patient to call with update regarding neck in in 2-3 weeks 4.  Return to clinic in 3-m4-monthr sooner as needed   The duration of this appointment visit was 30 minutes of face-to-face time with the patient.  Greater than 50% of this time was spent in counseling, explanation of diagnosis, planning of further management, and coordination of care.   Thank you for allowing me to participate in patient's care.  If I can answer any additional questions, I would be pleased to do so.    Sincerely,    Donika K. PatePosey Pronto

## 2014-05-25 NOTE — Progress Notes (Signed)
Referral and note faxed

## 2014-05-25 NOTE — Patient Instructions (Addendum)
1.  Start prednisone 60mg  (6 pills) and reduce by 10mg  (1 tablet) each day. 2.  After 3 days of starting prednsone, start gabapentin 100mg  at bedtime x 3 days, then increase to 2 tablets at bedtime x 3 days, then take 3 tablets at bedtime. 3.  Referral to Dr. Marikay Alaravid Jones for surgical opinion regarding cervical spondylosis 4.  Call me in 2-3 weeks with an update 5.  Return to clinic in 723-months

## 2014-06-01 ENCOUNTER — Other Ambulatory Visit: Payer: Self-pay | Admitting: Neurological Surgery

## 2014-06-01 DIAGNOSIS — M47812 Spondylosis without myelopathy or radiculopathy, cervical region: Secondary | ICD-10-CM

## 2014-06-08 ENCOUNTER — Ambulatory Visit
Admission: RE | Admit: 2014-06-08 | Discharge: 2014-06-08 | Disposition: A | Discharge status: Home / Self Care | Payer: BC Managed Care – PPO | Source: Ambulatory Visit | Attending: Neurological Surgery | Admitting: Neurological Surgery

## 2014-06-08 DIAGNOSIS — M47812 Spondylosis without myelopathy or radiculopathy, cervical region: Secondary | ICD-10-CM

## 2014-06-13 ENCOUNTER — Other Ambulatory Visit: Payer: Self-pay | Admitting: Neurological Surgery

## 2014-06-28 ENCOUNTER — Other Ambulatory Visit (HOSPITAL_COMMUNITY): Payer: Self-pay | Admitting: *Deleted

## 2014-06-29 ENCOUNTER — Ambulatory Visit (HOSPITAL_COMMUNITY)
Admission: RE | Admit: 2014-06-29 | Discharge: 2014-06-29 | Disposition: A | Discharge status: Home / Self Care | Payer: BC Managed Care – PPO | Source: Ambulatory Visit | Attending: Neurological Surgery | Admitting: Neurological Surgery

## 2014-06-29 ENCOUNTER — Encounter (HOSPITAL_COMMUNITY): Payer: Self-pay

## 2014-06-29 ENCOUNTER — Encounter (HOSPITAL_COMMUNITY)
Admission: RE | Admit: 2014-06-29 | Discharge: 2014-06-29 | Disposition: A | Discharge status: Home / Self Care | Payer: BC Managed Care – PPO | Source: Ambulatory Visit | Attending: Neurological Surgery | Admitting: Neurological Surgery

## 2014-06-29 DIAGNOSIS — Z01810 Encounter for preprocedural cardiovascular examination: Secondary | ICD-10-CM | POA: Insufficient documentation

## 2014-06-29 DIAGNOSIS — M479 Spondylosis, unspecified: Secondary | ICD-10-CM

## 2014-06-29 HISTORY — DX: Myoneural disorder, unspecified: G70.9

## 2014-06-29 HISTORY — DX: Unspecified osteoarthritis, unspecified site: M19.90

## 2014-06-29 LAB — BASIC METABOLIC PANEL
Anion gap: 11 (ref 5–15)
BUN: 26 mg/dL — ABNORMAL HIGH (ref 6–23)
CO2: 28 mEq/L (ref 19–32)
Calcium: 9.9 mg/dL (ref 8.4–10.5)
Chloride: 100 mEq/L (ref 96–112)
Creatinine, Ser: 1.04 mg/dL (ref 0.50–1.35)
GFR calc Af Amer: 90 mL/min — ABNORMAL LOW (ref >90)
GFR calc non Af Amer: 77 mL/min — ABNORMAL LOW (ref >90)
Glucose, Bld: 87 mg/dL (ref 70–99)
Potassium: 4.4 mEq/L (ref 3.7–5.3)
Sodium: 139 mEq/L (ref 137–147)

## 2014-06-29 LAB — SURGICAL PCR SCREEN
MRSA, PCR: NEGATIVE
Staphylococcus aureus: POSITIVE — AB

## 2014-06-29 LAB — CBC WITH DIFFERENTIAL/PLATELET
Basophils Absolute: 0 10*3/uL (ref 0.0–0.1)
Basophils Relative: 0 % (ref 0–1)
Eosinophils Absolute: 0.5 10*3/uL (ref 0.0–0.7)
Eosinophils Relative: 6 % — ABNORMAL HIGH (ref 0–5)
HCT: 47.5 % (ref 39.0–52.0)
Hemoglobin: 16.5 g/dL (ref 13.0–17.0)
Lymphocytes Relative: 29 % (ref 12–46)
Lymphs Abs: 2.1 10*3/uL (ref 0.7–4.0)
MCH: 31 pg (ref 26.0–34.0)
MCHC: 34.7 g/dL (ref 30.0–36.0)
MCV: 89.1 fL (ref 78.0–100.0)
Monocytes Absolute: 0.7 10*3/uL (ref 0.1–1.0)
Monocytes Relative: 10 % (ref 3–12)
Neutro Abs: 3.9 10*3/uL (ref 1.7–7.7)
Neutrophils Relative %: 55 % (ref 43–77)
Platelets: 241 10*3/uL (ref 150–400)
RBC: 5.33 MIL/uL (ref 4.22–5.81)
RDW: 12.3 % (ref 11.5–15.5)
WBC: 7.2 10*3/uL (ref 4.0–10.5)

## 2014-06-29 LAB — PROTIME-INR
INR: 1 (ref 0.00–1.49)
Prothrombin Time: 13.3 seconds (ref 11.6–15.2)

## 2014-06-29 NOTE — Pre-Procedure Instructions (Addendum)
Daniel CancelDouglas C Sitzer  06/29/2014   Your procedure is scheduled on:  07/06/2014  [facility]TRANCE A  at   10:30 AM.  Call this number if you have problems the morning of surgery: [phone]   Remember: STOP ALEVE, FISH OIL, Vitamin E as of Sunday - 07/02/2014   Do not eat food or drink liquids after midnight.  On WEDNESDAY   Take these medicines the morning of surgery with A SIP OF WATER: nothing    Do not wear jewelry  Do not wear lotions, powders, or perfumes. You may wear deodorant.   Men may shave face and neck.  Do not bring valuables to the hospital.  [facility] is not responsible                  for any belongings or valuables.               Contacts, dentures or bridgework may not be worn into surgery.   Leave suitcase in the car. After surgery it may be brought to your room.   For patients admitted to the hospital, discharge time is determined by your                treatment team.               Patients discharged the day of surgery will not be allowed to drive  home.  Name and phone number of your driver: /w family  Special Instructions: Special Instructions: [name] - Preparing for Surgery  Before surgery, you can play an important role.  Because skin is not sterile, your skin needs to be as free of germs as possible.  You can reduce the number of germs on you skin by washing with CHG (chlorahexidine gluconate) soap before surgery.  CHG is an antiseptic cleaner which kills germs and bonds with the skin to continue killing germs even after washing.  Please DO NOT use if you have an allergy to CHG or antibacterial soaps.  If your skin becomes reddened/irritated stop using the CHG and inform your nurse when you arrive at Short Stay.  Do not shave (including legs and underarms) for at least 48 hours prior to the first CHG shower.  You may shave your face.  Please follow these instructions carefully:   1.  Shower with CHG Soap the  night before surgery and the  morning of Surgery.  2.  If you choose to wash your hair, wash your hair first as usual with your  normal shampoo.  3.  After you shampoo, rinse your hair and body thoroughly to remove the  Shampoo.  4.  Use CHG as you would any other liquid soap.  You can apply chg directly to the skin and wash gently with scrungie or a clean washcloth.  5.  Apply the CHG Soap to your body ONLY FROM THE NECK DOWN.    Do not use on open wounds or open sores.  Avoid contact with your eyes, ears, mouth and genitals (private parts).  Wash genitals (private parts)   with your normal soap.  6.  Wash thoroughly, paying special attention to the area where your surgery will be performed.  7.  Thoroughly rinse your body with warm water from the neck down.  8.  DO NOT shower/wash with your normal soap after using and rinsing off   the CHG Soap.  9.  Pat yourself dry with a clean  towel.            10.  Wear clean pajamas.            11.  Place clean sheets on your bed the night of your first shower and do not sleep with pets.  Day of Surgery  Do not apply any lotions/deodorants the morning of surgery.  Please wear clean clothes to the hospital/surgery center.   Please read over the following fact sheets that you were given: Pain Booklet, Coughing and Deep Breathing, MRSA Information and Surgical Site Infection Prevention

## 2014-07-05 MED ORDER — DEXAMETHASONE SODIUM PHOSPHATE 10 MG/ML IJ SOLN
10.0000 mg | INTRAMUSCULAR | Status: AC
  Administered 2014-07-06: 10 mg via INTRAVENOUS
  Filled 2014-07-05: qty 1

## 2014-07-05 MED ORDER — CEFAZOLIN SODIUM-DEXTROSE 2-3 GM-% IV SOLR
2.0000 g | INTRAVENOUS | Status: AC
  Administered 2014-07-06: 2 g via INTRAVENOUS
  Filled 2014-07-05: qty 50

## 2014-07-05 NOTE — Progress Notes (Signed)
Patient called with time change to arrive at 530

## 2014-07-06 ENCOUNTER — Inpatient Hospital Stay (HOSPITAL_COMMUNITY): Payer: BC Managed Care – PPO | Admitting: Anesthesiology

## 2014-07-06 ENCOUNTER — Encounter (HOSPITAL_COMMUNITY)
Admission: RE | Disposition: A | Discharge status: Home / Self Care | Payer: Self-pay | Source: Ambulatory Visit | Attending: Neurological Surgery

## 2014-07-06 ENCOUNTER — Encounter (HOSPITAL_COMMUNITY): Payer: Self-pay

## 2014-07-06 ENCOUNTER — Ambulatory Visit (HOSPITAL_COMMUNITY)
Admission: RE | Admit: 2014-07-06 | Discharge: 2014-07-07 | Disposition: A | Discharge status: Home / Self Care | Payer: BC Managed Care – PPO | Source: Ambulatory Visit | Attending: Neurological Surgery | Admitting: Neurological Surgery

## 2014-07-06 ENCOUNTER — Inpatient Hospital Stay (HOSPITAL_COMMUNITY): Payer: BC Managed Care – PPO

## 2014-07-06 DIAGNOSIS — M479 Spondylosis, unspecified: Principal | ICD-10-CM | POA: Insufficient documentation

## 2014-07-06 DIAGNOSIS — M170 Bilateral primary osteoarthritis of knee: Secondary | ICD-10-CM | POA: Diagnosis not present

## 2014-07-06 DIAGNOSIS — M47892 Other spondylosis, cervical region: Secondary | ICD-10-CM | POA: Diagnosis present

## 2014-07-06 DIAGNOSIS — Z91040 Latex allergy status: Secondary | ICD-10-CM | POA: Insufficient documentation

## 2014-07-06 DIAGNOSIS — I10 Essential (primary) hypertension: Secondary | ICD-10-CM | POA: Insufficient documentation

## 2014-07-06 DIAGNOSIS — M47812 Spondylosis without myelopathy or radiculopathy, cervical region: Secondary | ICD-10-CM

## 2014-07-06 DIAGNOSIS — J45909 Unspecified asthma, uncomplicated: Secondary | ICD-10-CM | POA: Insufficient documentation

## 2014-07-06 DIAGNOSIS — Z981 Arthrodesis status: Secondary | ICD-10-CM

## 2014-07-06 DIAGNOSIS — Z79899 Other long term (current) drug therapy: Secondary | ICD-10-CM | POA: Diagnosis not present

## 2014-07-06 HISTORY — PX: ANTERIOR CERVICAL DECOMP/DISCECTOMY FUSION: SHX1161

## 2014-07-06 SURGERY — ANTERIOR CERVICAL DECOMPRESSION/DISCECTOMY FUSION 3 LEVELS
Anesthesia: General | Site: Neck

## 2014-07-06 MED ORDER — MIDAZOLAM HCL 2 MG/2ML IJ SOLN: 0.5000 mg | Freq: Once | INTRAMUSCULAR | Status: DC | PRN

## 2014-07-06 MED ORDER — SUFENTANIL CITRATE 50 MCG/ML IV SOLN
INTRAVENOUS | Status: DC | PRN
  Administered 2014-07-06 (×2): 5 ug via INTRAVENOUS
  Administered 2014-07-06: 10 ug via INTRAVENOUS

## 2014-07-06 MED ORDER — VECURONIUM BROMIDE 10 MG IV SOLR
INTRAVENOUS | Status: AC
  Filled 2014-07-06: qty 10

## 2014-07-06 MED ORDER — LOSARTAN POTASSIUM 50 MG PO TABS
50.0000 mg | ORAL_TABLET | Freq: Every day | ORAL | Status: DC
  Administered 2014-07-06: 50 mg via ORAL
  Filled 2014-07-06 (×2): qty 1

## 2014-07-06 MED ORDER — CEFAZOLIN SODIUM 1-5 GM-% IV SOLN
1.0000 g | Freq: Three times a day (TID) | INTRAVENOUS | Status: AC
  Administered 2014-07-06 – 2014-07-07 (×2): 1 g via INTRAVENOUS
  Filled 2014-07-06 (×2): qty 50

## 2014-07-06 MED ORDER — PROPOFOL 10 MG/ML IV BOLUS
INTRAVENOUS | Status: DC | PRN
  Administered 2014-07-06: 120 mg via INTRAVENOUS
  Administered 2014-07-06: 30 mg via INTRAVENOUS

## 2014-07-06 MED ORDER — LOSARTAN POTASSIUM-HCTZ 50-12.5 MG PO TABS: 1.0000 | ORAL_TABLET | Freq: Every day | ORAL | Status: DC

## 2014-07-06 MED ORDER — HYDROCHLOROTHIAZIDE 12.5 MG PO CAPS
12.5000 mg | ORAL_CAPSULE | Freq: Every day | ORAL | Status: DC
  Administered 2014-07-06: 12.5 mg via ORAL
  Filled 2014-07-06 (×2): qty 1

## 2014-07-06 MED ORDER — SODIUM CHLORIDE 0.9 % IJ SOLN
INTRAMUSCULAR | Status: AC
Start: 2014-07-06 — End: 2014-07-06
  Filled 2014-07-06: qty 10

## 2014-07-06 MED ORDER — VECURONIUM BROMIDE 10 MG IV SOLR
INTRAVENOUS | Status: DC | PRN
  Administered 2014-07-06: 1 mg via INTRAVENOUS
  Administered 2014-07-06: 3 mg via INTRAVENOUS
  Administered 2014-07-06: 2 mg via INTRAVENOUS
  Administered 2014-07-06: 1 mg via INTRAVENOUS

## 2014-07-06 MED ORDER — HYDROCODONE-ACETAMINOPHEN 5-325 MG PO TABS: 1.0000 | ORAL_TABLET | ORAL | Status: DC | PRN

## 2014-07-06 MED ORDER — GELATIN ABSORBABLE MT POWD
OROMUCOSAL | Status: DC | PRN
  Administered 2014-07-06: 10 mL via TOPICAL

## 2014-07-06 MED ORDER — DEXAMETHASONE 4 MG PO TABS
4.0000 mg | ORAL_TABLET | Freq: Four times a day (QID) | ORAL | Status: DC
  Administered 2014-07-06 – 2014-07-07 (×3): 4 mg via ORAL
  Filled 2014-07-06 (×7): qty 1

## 2014-07-06 MED ORDER — SODIUM CHLORIDE 0.9 % IJ SOLN
3.0000 mL | Freq: Two times a day (BID) | INTRAMUSCULAR | Status: DC
  Administered 2014-07-06 (×2): 3 mL via INTRAVENOUS

## 2014-07-06 MED ORDER — SUFENTANIL CITRATE 50 MCG/ML IV SOLN
INTRAVENOUS | Status: AC
  Filled 2014-07-06: qty 1

## 2014-07-06 MED ORDER — STERILE WATER FOR INJECTION IJ SOLN
INTRAMUSCULAR | Status: AC
  Filled 2014-07-06: qty 10

## 2014-07-06 MED ORDER — ALBUTEROL SULFATE (2.5 MG/3ML) 0.083% IN NEBU: 2.5000 mg | INHALATION_SOLUTION | Freq: Four times a day (QID) | RESPIRATORY_TRACT | Status: DC | PRN

## 2014-07-06 MED ORDER — METHOCARBAMOL 1000 MG/10ML IJ SOLN
500.0000 mg | Freq: Four times a day (QID) | INTRAVENOUS | Status: DC | PRN
  Filled 2014-07-06: qty 5

## 2014-07-06 MED ORDER — HYDROMORPHONE HCL 1 MG/ML IJ SOLN
0.2500 mg | INTRAMUSCULAR | Status: DC | PRN
Start: 2014-07-06 — End: 2014-07-06

## 2014-07-06 MED ORDER — GLYCOPYRROLATE 0.2 MG/ML IJ SOLN
INTRAMUSCULAR | Status: DC | PRN
  Administered 2014-07-06: .5 mg via INTRAVENOUS

## 2014-07-06 MED ORDER — GABAPENTIN 300 MG PO CAPS
300.0000 mg | ORAL_CAPSULE | Freq: Every day | ORAL | Status: DC
  Administered 2014-07-06: 300 mg via ORAL
  Filled 2014-07-06 (×2): qty 1

## 2014-07-06 MED ORDER — PROPOFOL 10 MG/ML IV BOLUS
INTRAVENOUS | Status: AC
  Filled 2014-07-06: qty 20

## 2014-07-06 MED ORDER — ONDANSETRON HCL 4 MG/2ML IJ SOLN: 4.0000 mg | INTRAMUSCULAR | Status: DC | PRN

## 2014-07-06 MED ORDER — PROMETHAZINE HCL 25 MG/ML IJ SOLN: 6.2500 mg | INTRAMUSCULAR | Status: DC | PRN

## 2014-07-06 MED ORDER — NEOSTIGMINE METHYLSULFATE 10 MG/10ML IV SOLN
INTRAVENOUS | Status: DC | PRN
  Administered 2014-07-06: 4 mg via INTRAVENOUS

## 2014-07-06 MED ORDER — ONDANSETRON HCL 4 MG/2ML IJ SOLN
INTRAMUSCULAR | Status: DC | PRN
  Administered 2014-07-06: 4 mg via INTRAVENOUS

## 2014-07-06 MED ORDER — ACETAMINOPHEN 10 MG/ML IV SOLN
INTRAVENOUS | Status: AC
  Administered 2014-07-06: 1000 mg via INTRAVENOUS
  Filled 2014-07-06: qty 100

## 2014-07-06 MED ORDER — METHOCARBAMOL 500 MG PO TABS
500.0000 mg | ORAL_TABLET | Freq: Four times a day (QID) | ORAL | Status: DC | PRN
  Administered 2014-07-06 – 2014-07-07 (×2): 500 mg via ORAL
  Filled 2014-07-06 (×2): qty 1

## 2014-07-06 MED ORDER — ONDANSETRON HCL 4 MG/2ML IJ SOLN
INTRAMUSCULAR | Status: AC
  Filled 2014-07-06: qty 2

## 2014-07-06 MED ORDER — SODIUM CHLORIDE 0.9 % IJ SOLN: 3.0000 mL | INTRAMUSCULAR | Status: DC | PRN

## 2014-07-06 MED ORDER — DEXAMETHASONE SODIUM PHOSPHATE 4 MG/ML IJ SOLN
4.0000 mg | Freq: Four times a day (QID) | INTRAMUSCULAR | Status: DC
  Filled 2014-07-06 (×4): qty 1

## 2014-07-06 MED ORDER — LACTATED RINGERS IV SOLN
INTRAVENOUS | Status: DC | PRN
  Administered 2014-07-06 (×2): via INTRAVENOUS

## 2014-07-06 MED ORDER — ALBUTEROL SULFATE HFA 108 (90 BASE) MCG/ACT IN AERS: 1.0000 | INHALATION_SPRAY | Freq: Four times a day (QID) | RESPIRATORY_TRACT | Status: DC | PRN

## 2014-07-06 MED ORDER — MIDAZOLAM HCL 2 MG/2ML IJ SOLN
INTRAMUSCULAR | Status: AC
  Filled 2014-07-06: qty 2

## 2014-07-06 MED ORDER — MIDAZOLAM HCL 5 MG/5ML IJ SOLN
INTRAMUSCULAR | Status: DC | PRN
  Administered 2014-07-06: 2 mg via INTRAVENOUS

## 2014-07-06 MED ORDER — ROCURONIUM BROMIDE 50 MG/5ML IV SOLN
INTRAVENOUS | Status: AC
  Filled 2014-07-06: qty 1

## 2014-07-06 MED ORDER — POTASSIUM CHLORIDE IN NACL 20-0.9 MEQ/L-% IV SOLN
INTRAVENOUS | Status: DC
  Filled 2014-07-06 (×3): qty 1000

## 2014-07-06 MED ORDER — MORPHINE SULFATE 2 MG/ML IJ SOLN: 1.0000 mg | INTRAMUSCULAR | Status: DC | PRN

## 2014-07-06 MED ORDER — EPHEDRINE SULFATE 50 MG/ML IJ SOLN
INTRAMUSCULAR | Status: DC | PRN
  Administered 2014-07-06 (×4): 5 mg via INTRAVENOUS

## 2014-07-06 MED ORDER — OXYCODONE-ACETAMINOPHEN 5-325 MG PO TABS: 1.0000 | ORAL_TABLET | ORAL | Status: DC | PRN

## 2014-07-06 MED ORDER — ACETAMINOPHEN 650 MG RE SUPP: 650.0000 mg | RECTAL | Status: DC | PRN

## 2014-07-06 MED ORDER — LIDOCAINE HCL (CARDIAC) 20 MG/ML IV SOLN
INTRAVENOUS | Status: DC | PRN
  Administered 2014-07-06: 100 mg via INTRATRACHEAL
  Administered 2014-07-06: 60 mg via INTRAVENOUS

## 2014-07-06 MED ORDER — THROMBIN 20000 UNITS EX SOLR
CUTANEOUS | Status: DC | PRN
  Administered 2014-07-06: 20 mL via TOPICAL

## 2014-07-06 MED ORDER — ROCURONIUM BROMIDE 100 MG/10ML IV SOLN
INTRAVENOUS | Status: DC | PRN
  Administered 2014-07-06: 50 mg via INTRAVENOUS

## 2014-07-06 MED ORDER — PHENOL 1.4 % MT LIQD: 1.0000 | OROMUCOSAL | Status: DC | PRN

## 2014-07-06 MED ORDER — SODIUM CHLORIDE 0.9 % IR SOLN
Status: DC | PRN
  Administered 2014-07-06: 500 mL

## 2014-07-06 MED ORDER — MENTHOL 3 MG MT LOZG
1.0000 | LOZENGE | OROMUCOSAL | Status: DC | PRN
  Administered 2014-07-07: 3 mg via ORAL
  Filled 2014-07-06: qty 9

## 2014-07-06 MED ORDER — MEPERIDINE HCL 25 MG/ML IJ SOLN: 6.2500 mg | INTRAMUSCULAR | Status: DC | PRN

## 2014-07-06 MED ORDER — ACETAMINOPHEN 325 MG PO TABS: 650.0000 mg | ORAL_TABLET | ORAL | Status: DC | PRN

## 2014-07-06 MED ORDER — LIDOCAINE HCL (CARDIAC) 20 MG/ML IV SOLN
INTRAVENOUS | Status: AC
  Filled 2014-07-06: qty 5

## 2014-07-06 MED ORDER — 0.9 % SODIUM CHLORIDE (POUR BTL) OPTIME
TOPICAL | Status: DC | PRN
  Administered 2014-07-06: 1000 mL

## 2014-07-06 SURGICAL SUPPLY — 59 items
APL SKNCLS STERI-STRIP NONHPOA (GAUZE/BANDAGES/DRESSINGS) ×1
BAG DECANTER FOR FLEXI CONT (MISCELLANEOUS) ×2 IMPLANT
BENZOIN TINCTURE PRP APPL 2/3 (GAUZE/BANDAGES/DRESSINGS) ×2 IMPLANT
BIT DRILL POWER (BIT) IMPLANT
BONE MATRIX OSTEOCEL PRO SM (Bone Implant) ×1 IMPLANT
BUR MATCHSTICK NEURO 3.0 LAGG (BURR) ×3 IMPLANT
CAGE COROENT SM 6X13X15 (Cage) ×1 IMPLANT
CAGE SMALL 7X13X15 (Cage) ×2 IMPLANT
CANISTER SUCT 3000ML (MISCELLANEOUS) ×2 IMPLANT
CONT SPEC 4OZ CLIKSEAL STRL BL (MISCELLANEOUS) ×2 IMPLANT
DRAPE C-ARM 42X72 X-RAY (DRAPES) ×4 IMPLANT
DRAPE LAPAROTOMY 100X72 PEDS (DRAPES) ×2 IMPLANT
DRAPE MICROSCOPE LEICA (MISCELLANEOUS) ×2 IMPLANT
DRAPE POUCH INSTRU U-SHP 10X18 (DRAPES) ×2 IMPLANT
DRILL BIT POWER (BIT) ×2
DRSG OPSITE 4X5.5 SM (GAUZE/BANDAGES/DRESSINGS) ×2 IMPLANT
DRSG TELFA 3X8 NADH (GAUZE/BANDAGES/DRESSINGS) ×2 IMPLANT
DURAPREP 6ML APPLICATOR 50/CS (WOUND CARE) ×2 IMPLANT
ELECT COATED BLADE 2.86 ST (ELECTRODE) ×2 IMPLANT
ELECT REM PT RETURN 9FT ADLT (ELECTROSURGICAL) ×2
ELECTRODE REM PT RTRN 9FT ADLT (ELECTROSURGICAL) ×1 IMPLANT
GAUZE SPONGE 4X4 16PLY XRAY LF (GAUZE/BANDAGES/DRESSINGS) IMPLANT
GLOVE BIO SURGEON STRL SZ8 (GLOVE) ×1 IMPLANT
GLOVE SS N UNI LF 6.5 STRL (GLOVE) ×2 IMPLANT
GLOVE SS N UNI LF 8.0 STRL (GLOVE) ×2 IMPLANT
GLOVE SS N UNI LF 8.5 STRL (GLOVE) ×1 IMPLANT
GOWN STRL REUS W/ TWL LRG LVL3 (GOWN DISPOSABLE) IMPLANT
GOWN STRL REUS W/ TWL XL LVL3 (GOWN DISPOSABLE) ×1 IMPLANT
GOWN STRL REUS W/TWL 2XL LVL3 (GOWN DISPOSABLE) IMPLANT
GOWN STRL REUS W/TWL LRG LVL3 (GOWN DISPOSABLE)
GOWN STRL REUS W/TWL XL LVL3 (GOWN DISPOSABLE) ×2
HALTER HD/CHIN CERV TRACTION D (MISCELLANEOUS) IMPLANT
HEMOSTAT POWDER KIT SURGIFOAM (HEMOSTASIS) ×2 IMPLANT
HEMOSTAT POWDER SURGIFOAM 1G (HEMOSTASIS) ×1 IMPLANT
KIT BASIN OR (CUSTOM PROCEDURE TRAY) ×2 IMPLANT
KIT ROOM TURNOVER OR (KITS) ×2 IMPLANT
NDL HYPO 25X1 1.5 SAFETY (NEEDLE) ×1 IMPLANT
NDL SPNL 20GX3.5 QUINCKE YW (NEEDLE) ×1 IMPLANT
NEEDLE HYPO 25X1 1.5 SAFETY (NEEDLE) ×2 IMPLANT
NEEDLE SPNL 20GX3.5 QUINCKE YW (NEEDLE) ×2 IMPLANT
NS IRRIG 1000ML POUR BTL (IV SOLUTION) ×2 IMPLANT
PACK LAMINECTOMY NEURO (CUSTOM PROCEDURE TRAY) ×2 IMPLANT
PAD ARMBOARD 7.5X6 YLW CONV (MISCELLANEOUS) ×2 IMPLANT
PAD DRESSING TELFA 3X8 NADH (GAUZE/BANDAGES/DRESSINGS) ×1 IMPLANT
PLATE HELIX T 54 (Plate) IMPLANT
PLATE HELIX T 54MM (Plate) ×2 IMPLANT
RUBBERBAND STERILE (MISCELLANEOUS) ×4 IMPLANT
SCREW FIXED SELF TAP 4.0X13MM (Screw) ×1 IMPLANT
SCREW FIXED SELF TAP 4X15 (Screw) ×7 IMPLANT
SPONGE INTESTINAL PEANUT (DISPOSABLE) ×2 IMPLANT
SPONGE SURGIFOAM ABS GEL 100 (HEMOSTASIS) ×2 IMPLANT
STRIP CLOSURE SKIN 1/2X4 (GAUZE/BANDAGES/DRESSINGS) ×2 IMPLANT
SUT VIC AB 3-0 SH 8-18 (SUTURE) ×2 IMPLANT
SYR 20ML ECCENTRIC (SYRINGE) IMPLANT
TAPE STRIPS DRAPE STRL (GAUZE/BANDAGES/DRESSINGS) ×1 IMPLANT
TOWEL OR 17X24 6PK STRL BLUE (TOWEL DISPOSABLE) ×2 IMPLANT
TOWEL OR 17X26 10 PK STRL BLUE (TOWEL DISPOSABLE) ×2 IMPLANT
TRAP SPECIMEN MUCOUS 40CC (MISCELLANEOUS) IMPLANT
WATER STERILE IRR 1000ML POUR (IV SOLUTION) ×2 IMPLANT

## 2014-07-06 NOTE — Plan of Care (Signed)
Problem: Consults Goal: Diagnosis - Spinal Surgery Outcome: Completed/Met Date Met:  07/06/14 Cervical Spine Fusion

## 2014-07-06 NOTE — Anesthesia Postprocedure Evaluation (Signed)
  Anesthesia Post Note  Patient: Daniel Rush  Procedure(s) Performed: Procedure(s) (LRB): ANTERIOR CERVICAL DECOMPRESSION/DISCECTOMY FUSION 3 LEVELS (N/A)  Anesthesia type: GA  Patient location: PACU  Post pain: Pain level controlled  Post assessment: Post-op Vital signs reviewed  Last Vitals:  Filed Vitals:   07/06/14 1215  BP: 162/60  Pulse: 58  Temp:   Resp: 18    Post vital signs: Reviewed  Level of consciousness: sedated  Complications: No apparent anesthesia complications

## 2014-07-06 NOTE — Anesthesia Procedure Notes (Signed)
Procedure Name: Intubation Date/Time: 07/06/2014 7:53 AM Performed by: Lovie CholOCK, Chandler Stofer K Pre-anesthesia Checklist: Patient identified, Emergency Drugs available, Suction available, Patient being monitored and Timeout performed Patient Re-evaluated:Patient Re-evaluated prior to inductionOxygen Delivery Method: Circle system utilized Preoxygenation: Pre-oxygenation with 100% oxygen Intubation Type: IV induction Ventilation: Mask ventilation without difficulty Grade View: Grade I Tube type: Oral Tube size: 7.5 mm Number of attempts: 1 Airway Equipment and Method: Stylet and Video-laryngoscopy Placement Confirmation: positive ETCO2,  CO2 detector and breath sounds checked- equal and bilateral Secured at: 22 cm Tube secured with: Tape Dental Injury: Teeth and Oropharynx as per pre-operative assessment

## 2014-07-06 NOTE — Op Note (Signed)
07/06/2014  11:51 AM  PATIENT:  Daniel Rush  58 y.o. 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 Decompressive anterior cervical discectomy C3-4, C4-5, C5-6, 2. Anterior cervical arthrodesis C3-4, C4-5, C5-6 with peek interbody cages packed with local autograft and morselized allograft, 3. Anterior cervical plating C3-C7 inclusive utilizing a Helix translational plate  SURGEON:  Marikay Alaravid Krystal Teachey, MD  ASSISTANTS: Dr. Venetia MaxonStern  ANESTHESIA:   General  EBL: 25 ml  Total I/O In: 1200 [I.V.:1200] Out: 25 [Blood:25]  BLOOD ADMINISTERED:none  DRAINS: Flat JP drain   SPECIMEN:  No Specimen  INDICATION FOR PROCEDURE:   This patient presented with a long history of neck pain with radiation into the right arm. He has had right arm weakness in the C5 distribution. EMG showed multilevel radiculopathy. He tried medical management without relief. Severe spondylosis with stenosis. Patient understood the risks, benefits, and alternatives and potential outcomes and wished to proceed.  PROCEDURE DETAILS: Patient was brought to the operating room placed under general endotracheal anesthesia. Patient was placed in the supine position on the operating room table. The neck was prepped with Duraprep and draped in a sterile fashion.   Three cc of local anesthesia was injected and a transverse incision was made on the right side of the neck.  Dissection was carried down thru the subcutaneous tissue and the platysma was  elevated, opened, and undermined with Metzenbaum scissors.  Dissection was then carried out thru an avascular plane leaving the sternocleidomastoid carotid artery and jugular vein laterally and the trachea and esophagus medially. The ventral aspect of the vertebral column was identified and a localizing x-ray was taken. The C4-5 level  was identified. The longus colli muscles were then elevated and the retractor was placed to expose C3-4 C4-5 and C5-6. The annulus was incised and the disc space entered at each level. Discectomy was performed with micro-curettes and pituitary rongeurs. I then used the high-speed drill to drill the endplates down to the level of the posterior longitudinal ligament. The drill shavings were saved in a mucous trap for later arthrodesis. The discs were extremely collapsed and were drilled to a height of 6-7 mm. The operating microscope was draped and brought into the field provided additional magnification, illumination and visualization. Discectomy was continued posteriorly thru the disc space. Posterior longitudinal ligament was opened with a nerve hook, and then removed along with disc herniation and osteophytes, decompressing the spinal canal and thecal sac. We then continued to remove osteophytic overgrowth and disc material decompressing the neural foramina and exiting nerve roots bilaterally at each level. The scope was angled up and down to help decompress and undercut the vertebral bodies. Once the decompression was completed we could pass a nerve hook circumferentially to assure adequate decompression in the midline and in the neural foramina. So by both visualization and palpation we felt we had an adequate decompression of the neural elements. We then measured the height of the intravertebral disc space and selected a 7 millimeter Peek interbody cage packed with autograft and morcellized allograft. It was then gently positioned in the intravertebral disc space and countersunk. I then used a Helix translational plate and placed fixed angle screws into the vertebral bodies and locked them into position. The wound was irrigated with bacitracin solution, checked for hemostasis which was established and confirmed. Once meticulous hemostasis was achieved, we  then proceeded with closure. The platysma was closed  with interrupted 3-0 undyed Vicryl suture, the subcuticular layer was closed with interrupted 3-0 undyed Vicryl suture. The skin edges were approximated with steristrips. The drapes were removed. A sterile dressing was applied. The patient was then awakened from general anesthesia and transferred to the recovery room in stable condition. At the end of the procedure all sponge, needle and instrument counts were correct.   PLAN OF CARE: Admit for overnight observation  PATIENT DISPOSITION:  PACU - hemodynamically stable.   Delay start of Pharmacological VTE agent (>24hrs) due to surgical blood loss or risk of bleeding:  yes

## 2014-07-06 NOTE — H&P (Signed)
Subjective:   Patient is a 58 y.o. male admitted for ACDF. The patient first presented to me with complaints of neck pain with weakness in RUE. Onset of symptoms was several months ago. The pain is described as aching and occurs all day. The pain is rated severe, and is located base of the neck and radiates to the RUE. The symptoms have been progressive. Symptoms are exacerbated by activity and are relieved by nothing.  Previous work up includes MRI.  Past Medical History  Diagnosis Date  . Hypertension   . Asthma     rare use of an inhaler   . Neuromuscular disorder     spondylosis   . Arthritis     knees     Past Surgical History  Procedure Laterality Date  . Neck surgery      C4-C5  . Knee surgery Bilateral     X2 on R , x1 surg. on L - for ACL, & meniscus  . Cervical disc surgery  1999    Allergies  Allergen Reactions  . Latex Rash    History  Substance Use Topics  . Smoking status: Never Smoker   . Smokeless tobacco: Not on file  . Alcohol Use: 1.2 oz/week    2 Glasses of wine per week     Comment: daily with dinner 1 glass of wine    Family History  Problem Relation Age of Onset  . Alzheimer's disease Mother     Deceased, 482  . Hypertension Mother   . Heart failure Father     Deceased, 2577  . Hypertension Father   . Hyperlipidemia Brother   . Healthy Daughter   . Healthy Son    Prior to Admission medications   Medication Sig Start Date End Date Taking? Authorizing Provider  ANDROGEL PUMP 20.25 MG/ACT (1.62%) GEL Apply 2 Act topically daily.  04/17/14  Yes Historical Provider, MD  CHONDROITIN SULFATE PO Take 1 tablet by mouth 2 (two) times daily.   Yes Historical Provider, MD  Cyanocobalamin (VITAMIN B-12 PO) Take 1 tablet by mouth daily.    Yes Historical Provider, MD  gabapentin (NEURONTIN) 100 MG capsule Take 3 capsules (300 mg total) by mouth at bedtime. Patient taking differently: Take 300 mg by mouth daily as needed.  05/25/14  Yes Donika K Patel, DO   LECITHIN PO Take 1 capsule by mouth daily.   Yes Historical Provider, MD  losartan-hydrochlorothiazide (HYZAAR) 50-12.5 MG per tablet Take 1 tablet by mouth daily.   Yes Historical Provider, MD  Multiple Vitamins-Minerals (MULTIVITAMIN WITH MINERALS) tablet Take 1 tablet by mouth daily.   Yes Historical Provider, MD  naproxen sodium (ANAPROX) 220 MG tablet Take 220 mg by mouth 2 (two) times daily with a meal.   Yes Historical Provider, MD  Omega-3 Fatty Acids (FISH OIL PO) Take 1 capsule by mouth daily.   Yes Historical Provider, MD  ST JOHNS WORT PO Take 1 tablet by mouth daily.    Yes Historical Provider, MD  vitamin C (ASCORBIC ACID) 500 MG tablet Take 500 mg by mouth daily.   Yes Historical Provider, MD  VITAMIN E PO Take 1 capsule by mouth daily.    Yes Historical Provider, MD  albuterol (PROVENTIL HFA;VENTOLIN HFA) 108 (90 BASE) MCG/ACT inhaler Inhale into the lungs every 6 (six) hours as needed for wheezing or shortness of breath.    Historical Provider, MD  predniSONE (DELTASONE) 10 MG tablet Take 6 tablets x 1 day, then 5 tab  x 1 day, then 4 tab x 1 day, then 3 tab, then 2 tab, then 1 tab. Patient not taking: Reported on 06/26/2014 05/25/14   [place] Chardonika K Patel, DO     Review of Systems  Positive ROS: neg  All other systems have been reviewed and were otherwise negative with the exception of those mentioned in the HPI and as above.  Objective: Vital signs in last 24 hours: Temp:  [97.9 F (36.6 C)] 97.9 F (36.6 C) (12/17 0557) Pulse Rate:  [51] 51 (12/17 0557) Resp:  [18] 18 (12/17 0557) BP: (180)/(77) 180/77 mmHg (12/17 0557) SpO2:  [100 %] 100 % (12/17 0557) Weight:  [151 lb 13 oz (68.862 kg)] 151 lb 13 oz (68.862 kg) (12/17 0557)  General Appearance: Alert, cooperative, no distress, appears stated age Head: Normocephalic, without obvious abnormality, atraumatic Eyes: PERRL, conjunctiva/corneas clear, EOM's intact      Neck: Supple, symmetrical, trachea midline, Back:  Symmetric, no curvature, ROM normal, no CVA tenderness Lungs:  respirations unlabored Heart: Regular rate and rhythm Abdomen: Soft, non-tender Extremities: Extremities normal, atraumatic, no cyanosis or edema Pulses: 2+ and symmetric all extremities Skin: Skin color, texture, turgor normal, no rashes or lesions  NEUROLOGIC:  Mental status: Alert and oriented x4, no aphasia, good attention span, fund of knowledge and memory  Motor Exam - grossly normal Sensory Exam - grossly normal Reflexes: 1+ Coordination - grossly normal Gait - grossly normal Balance - grossly normal Cranial Nerves: I: smell Not tested  II: visual acuity  OS: nl    OD: nl  II: visual fields Full to confrontation  II: pupils Equal, round, reactive to light  III,VII: ptosis None  III,IV,VI: extraocular muscles  Full ROM  V: mastication Normal  V: facial light touch sensation  Normal  V,VII: corneal reflex  Present  VII: facial muscle function - upper  Normal  VII: facial muscle function - lower Normal  VIII: hearing Not tested  IX: soft palate elevation  Normal  IX,X: gag reflex Present  XI: trapezius strength  5/5  XI: sternocleidomastoid strength 5/5  XI: neck flexion strength  5/5  XII: tongue strength  Normal    Data Review Lab Results  Component Value Date   WBC 7.2 06/29/2014   HGB 16.5 06/29/2014   HCT 47.5 06/29/2014   MCV 89.1 06/29/2014   PLT 241 06/29/2014   Lab Results  Component Value Date   NA 139 06/29/2014   K 4.4 06/29/2014   CL 100 06/29/2014   CO2 28 06/29/2014   BUN 26* 06/29/2014   CREATININE 1.04 06/29/2014   GLUCOSE 87 06/29/2014   Lab Results  Component Value Date   INR 1.00 06/29/2014    Assessment:   Cervical neck pain with herniated nucleus pulposus/ spondylosis/ stenosis at C3-T1. Patient has failed conservative therapy. Planned surgery : ACDF with plate Z6-1C3-4, W9-6C4-5, C5-6  Plan:   I explained the condition and procedure to the patient and answered any  questions.  Patient wishes to proceed with procedure as planned. Understands risks/ benefits/ and expected or typical outcomes.  Derriona Branscom S 07/06/2014 7:05 AM

## 2014-07-06 NOTE — Transfer of Care (Signed)
Immediate Anesthesia Transfer of Care Note  Patient: Daniel Rush  Procedure(s) Performed: Procedure(s) with comments: ANTERIOR CERVICAL DECOMPRESSION/DISCECTOMY FUSION 3 LEVELS (N/A) - ANTERIOR CERVICAL DECOMPRESSION/DISCECTOMY FUSION 3 LEVELS  Patient Location: PACU  Anesthesia Type:General  Level of Consciousness: awake, alert , oriented and patient cooperative  Airway & Oxygen Therapy: Patient Spontanous Breathing and Patient connected to nasal cannula oxygen  Post-op Assessment: Report given to PACU RN and Post -op Vital signs reviewed and stable  Post vital signs: Reviewed  Complications: No apparent anesthesia complications

## 2014-07-06 NOTE — Anesthesia Preprocedure Evaluation (Addendum)
Anesthesia Evaluation  Patient identified by MRN, date of birth, ID band Patient awake    Reviewed: Allergy & Precautions, H&P , Patient's Chart, lab work & pertinent test results, reviewed documented beta blocker date and time   History of Anesthesia Complications Negative for: history of anesthetic complications  Airway Mallampati: II  TM Distance: >3 FB Neck ROM: full    Dental  (+) Teeth Intact, Dental Advisory Given   Pulmonary asthma ,  breath sounds clear to auscultation        Cardiovascular Exercise Tolerance: Good hypertension, Rhythm:regular Rate:Normal     Neuro/Psych  Neuromuscular disease    GI/Hepatic   Endo/Other    Renal/GU      Musculoskeletal  (+) Arthritis -,   Abdominal   Peds  Hematology   Anesthesia Other Findings   Reproductive/Obstetrics                            Anesthesia Physical Anesthesia Plan  ASA: II  Anesthesia Plan: General ETT   Post-op Pain Management:    Induction:   Airway Management Planned: Video Laryngoscope Planned  Additional Equipment:   Intra-op Plan:   Post-operative Plan:   Informed Consent: I have reviewed the patients History and Physical, chart, labs and discussed the procedure including the risks, benefits and alternatives for the proposed anesthesia with the patient or authorized representative who has indicated his/her understanding and acceptance.   Dental Advisory Given  Plan Discussed with: CRNA and Surgeon  Anesthesia Plan Comments:         Anesthesia Quick Evaluation

## 2014-07-07 DIAGNOSIS — M479 Spondylosis, unspecified: Secondary | ICD-10-CM | POA: Diagnosis not present

## 2014-07-07 MED ORDER — METHOCARBAMOL 500 MG PO TABS: 500.0000 mg | ORAL_TABLET | Freq: Four times a day (QID) | ORAL | Status: AC | PRN

## 2014-07-07 MED ORDER — HYDROCODONE-ACETAMINOPHEN 5-325 MG PO TABS: 1.0000 | ORAL_TABLET | Freq: Four times a day (QID) | ORAL | Status: DC | PRN

## 2014-07-07 NOTE — Progress Notes (Signed)
Patient alert and oriented, mae's well, voiding adequate amount of urine, swallowing without difficulty, no c/o pain. Patient discharged home with family. Script and discharged instructions given to patient. Patient and family stated understanding of d/c instructions given and has an appointment with MD. Aisha RN  

## 2014-07-07 NOTE — Discharge Summary (Signed)
Physician Discharge Summary  Patient ID: Daniel Rush MRN: [id number] DOB/AGE: [birth year] 58 y.o.  Admit date: 07/06/2014 Discharge date: 07/07/2014  Admission Diagnoses: Cervical spondylosis    Discharge Diagnoses: Same   Discharged Condition: good  Hospital Course: The patient was admitted on 07/06/2014 and taken to the operating room where the patient underwent ACDF C3-4 C4-5 C5-6. The patient tolerated the procedure well and was taken to the recovery room and then to the floor in stable condition. The hospital course was routine. There were no complications. The wound remained clean dry and intact. Pt had appropriate neck soreness. No complaints of arm pain or new N/T/W. The patient remained afebrile with stable vital signs, and tolerated a regular diet. The patient continued to increase activities, and pain was well controlled with oral pain medications.   Consults: None  Significant Diagnostic Studies:  Results for orders placed or performed during the hospital encounter of 06/29/14  Surgical pcr screen  Result Value Ref Range   MRSA, PCR NEGATIVE NEGATIVE   Staphylococcus aureus POSITIVE (A) NEGATIVE  Basic metabolic panel  Result Value Ref Range   Sodium 139 137 - 147 mEq/L   Potassium 4.4 3.7 - 5.3 mEq/L   Chloride 100 96 - 112 mEq/L   CO2 28 19 - 32 mEq/L   Glucose, Bld 87 70 - 99 mg/dL   BUN 26 (H) 6 - 23 mg/dL   Creatinine, Ser 6.57 0.50 - 1.35 mg/dL   Calcium 9.9 8.4 - 84.6 mg/dL   GFR calc non Af Amer 77 (L) >90 mL/min   GFR calc Af Amer 90 (L) >90 mL/min   Anion gap 11 5 - 15  CBC WITH DIFFERENTIAL  Result Value Ref Range   WBC 7.2 4.0 - 10.5 K/uL   RBC 5.33 4.22 - 5.81 MIL/uL   Hemoglobin 16.5 13.0 - 17.0 g/dL   HCT 96.2 95.2 - 84.1 %   MCV 89.1 78.0 - 100.0 fL   MCH 31.0 26.0 - 34.0 pg   MCHC 34.7 30.0 - 36.0 g/dL   RDW 32.4 40.1 - 02.7 %   Platelets 241 150 - 400 K/uL   Neutrophils Relative % 55 43 - 77 %   Neutro Abs 3.9 1.7 - 7.7 K/uL   Lymphocytes Relative 29 12 - 46 %   Lymphs Abs 2.1 0.7 - 4.0 K/uL   Monocytes Relative 10 3 - 12 %   Monocytes Absolute 0.7 0.1 - 1.0 K/uL   Eosinophils Relative 6 (H) 0 - 5 %   Eosinophils Absolute 0.5 0.0 - 0.7 K/uL   Basophils Relative 0 0 - 1 %   Basophils Absolute 0.0 0.0 - 0.1 K/uL  Protime-INR  Result Value Ref Range   Prothrombin Time 13.3 11.6 - 15.2 seconds   INR 1.00 0.00 - 1.49    Chest 2 View  06/29/2014   CLINICAL DATA:  Preop for neck surgery  EXAM: CHEST  2 VIEW  COMPARISON:  Chest x-ray of 01/31/2013 .  FINDINGS: No active infiltrate or effusion is seen. Mediastinal and hilar contours are unremarkable. The heart is within normal limits in size. Slight curvature of the thoracic spine is stable.  IMPRESSION: No active cardiopulmonary disease.   Electronically Signed   By: Dwyane Dee M.D.   On: 06/29/2014 09:24   Dg Cervical Spine 1 View  07/06/2014   CLINICAL DATA:  ACDF at C3-4, C4-5, C5-6  EXAM: DG C-ARM 61-120 MIN; DG CERVICAL SPINE - 1 VIEW  COMPARISON:  None.  FINDINGS: Fluoro time reported as 10 seconds.  The patient has undergone anterior cervical fusion and intradiscal device placement at C 3-4, C4-5, and C5-6. No immediate postprocedure complication is demonstrated. There are mild degenerative anterior endplate spurs at Z6-1C6-7.  IMPRESSION: Status post ACDF without evidence of immediate postprocedure complication.   Electronically Signed   By: Daniyla Pfahler  [place]   On: 07/06/2014 11:52   Ct Cervical Spine Wo Contrast  06/08/2014   CLINICAL DATA:  Cervical spondylosis without myelopathy. Chronic neck pain. History of cervical spine operation in 1989. RIGHT arm weakness. Numbness and pain. Subsequent encounter.  EXAM: CT CERVICAL SPINE WITHOUT CONTRAST  TECHNIQUE: Multidetector CT imaging of the cervical spine was performed without intravenous contrast. Multiplanar CT image reconstructions were also generated.  COMPARISON:  MRI 03/08/2014.  FINDINGS: Alignment: Reversal of the  normal cervical lordosis. 3 mm of retrolisthesis of C4 on C5. 1 mm retrolisthesis of C3 on C4. 2 mm retrolisthesis of C6 on C7. 1 mm retrolisthesis of C5 on C6. In addition to the spondylolisthesis, there is a levoconvex cervicothoracic curvature with the apex at C6-C7. Craniocervical alignment appears within normal limits. Predental space is normal. Occipital condyles appear intact.  Vertebrae: No destructive osseous lesions.  Marginal osteophytes.  Paraspinal tissues: Carotid atherosclerosis. Mild pleural apical scarring bilaterally.  Disc levels:  C2-C3:  No bony central stenosis.  Foramina patent.  C3-C4: Severe disc degeneration with RIGHT-greater-than-LEFT foraminal stenosis due to uncovertebral spurring. The spurring is superimposed on short pedicles.  C4-C5: Severe disc degeneration. Bilateral symmetric foraminal stenosis due to uncovertebral spurring. Shallow disc osteophyte complex.  C5-C6: Severe disc degeneration. Shallow disc osteophyte complex with osseous ridging in the LEFT paracentral region. RIGHT-greater-than-LEFT foraminal stenosis due to uncovertebral spurring.  C6-C7: Severe degenerative disc disease. Shallow disc osteophyte complex without bony central stenosis bilateral RIGHT-greater-than-LEFT foraminal stenosis due to uncovertebral spurring.  C7-T1: Severe RIGHT facet arthrosis. Mild RIGHT foraminal stenosis associated with anterior facet spurring. The LEFT neural foramen appears patent. Central canal appears patent.  T1-T2: Severe RIGHT-sided facet arthrosis with subchondral cysts on both sides of the facet joint. Severe RIGHT foraminal stenosis associated with uncovertebral spurring and facet arthrosis.  IMPRESSION: Short pedicles with superimposed RIGHT-sided predominant degenerative disease. Multilevel foraminal stenosis potentially affecting the RIGHT C4 through RIGHT T1 nerves predominantly due to uncovertebral spurring with some contribution from facet arthrosis in the lower cervical  and upper thoracic spine.   Electronically Signed   By: Andreas NewportGeoffrey  Lamke M.D.   On: 06/08/2014 09:22   Dg C-arm 1-60 Min  07/06/2014   CLINICAL DATA:  ACDF at C3-4, C4-5, C5-6  EXAM: DG C-ARM 61-120 MIN; DG CERVICAL SPINE - 1 VIEW  COMPARISON:  None.  FINDINGS: Fluoro time reported as 10 seconds.  The patient has undergone anterior cervical fusion and intradiscal device placement at C 3-4, C4-5, and C5-6. No immediate postprocedure complication is demonstrated. There are mild degenerative anterior endplate spurs at W9-6C6-7.  IMPRESSION: Status post ACDF without evidence of immediate postprocedure complication.   Electronically Signed   By: Bodin Gorka  [place]   On: 07/06/2014 11:52    Antibiotics:  Anti-infectives    Start     Dose/Rate Route Frequency Ordered Stop   07/06/14 1600  ceFAZolin (ANCEF) IVPB 1 g/50 mL premix     1 g100 mL/hr over 30 Minutes Intravenous Every 8 hours 07/06/14 1356 07/07/14 0043   07/06/14 0827  bacitracin 50,000 Units in sodium chloride irrigation 0.9 % 500 mL irrigation  Status:  Discontinued       As needed 07/06/14 0827 07/06/14 1140   07/06/14 0600  ceFAZolin (ANCEF) IVPB 2 g/50 mL premix     2 g100 mL/hr over 30 Minutes Intravenous On call to O.R. 07/05/14 1329 07/06/14 0755      Discharge Exam: Blood pressure 127/62, pulse 65, temperature 98.5 F (36.9 C), temperature source Oral, resp. rate 18, height 5\' 9"  (1.753 m), weight 151 lb 13 oz (68.862 kg), SpO2 98 %. Neurologic: Grossly normal, mild right C5 weakness stable from preop Incision clean dry and intact  Discharge Medications:     Medication List    STOP taking these medications        naproxen sodium 220 MG tablet  Commonly known as:  ANAPROX     predniSONE 10 MG tablet  Commonly known as:  DELTASONE      TAKE these medications        albuterol 108 (90 BASE) MCG/ACT inhaler  Commonly known as:  PROVENTIL HFA;VENTOLIN HFA  Inhale into the lungs every 6 (six) hours as needed for wheezing or  shortness of breath.     ANDROGEL PUMP 20.25 MG/ACT (1.62%) Gel  Generic drug:  Testosterone  Apply 2 Act topically daily.     CHONDROITIN SULFATE PO  Take 1 tablet by mouth 2 (two) times daily.     FISH OIL PO  Take 1 capsule by mouth daily.     gabapentin 100 MG capsule  Commonly known as:  NEURONTIN  Take 3 capsules (300 mg total) by mouth at bedtime.     HYDROcodone-acetaminophen 5-325 MG per tablet  Commonly known as:  NORCO/VICODIN  Take 1-2 tablets by mouth every 6 (six) hours as needed (mild pain).     LECITHIN PO  Take 1 capsule by mouth daily.     losartan-hydrochlorothiazide 50-12.5 MG per tablet  Commonly known as:  HYZAAR  Take 1 tablet by mouth daily.     methocarbamol 500 MG tablet  Commonly known as:  ROBAXIN  Take 1 tablet (500 mg total) by mouth every 6 (six) hours as needed for muscle spasms.     multivitamin with minerals tablet  Take 1 tablet by mouth daily.     ST JOHNS WORT PO  Take 1 tablet by mouth daily.     VITAMIN B-12 PO  Take 1 tablet by mouth daily.     vitamin C 500 MG tablet  Commonly known as:  ASCORBIC ACID  Take 500 mg by mouth daily.     VITAMIN E PO  Take 1 capsule by mouth daily.        Disposition: Home   Final Dx: ACDF with plate Z6-1C3-4 W9-6C4-5 C5-6      Discharge Instructions    Call MD for:  difficulty breathing, headache or visual disturbances    Complete by:  As directed      Call MD for:  persistant nausea and vomiting    Complete by:  As directed      Call MD for:  redness, tenderness, or signs of infection (pain, swelling, redness, odor or green/yellow discharge around incision site)    Complete by:  As directed      Call MD for:  severe uncontrolled pain    Complete by:  As directed      Call MD for:  temperature >100.4    Complete by:  As directed      Diet - low sodium heart healthy  Complete by:  As directed      Discharge instructions    Complete by:  As directed   Limit driving, may shower, no  heavy lifting,     Increase activity slowly    Complete by:  As directed            Follow-up Information    Follow up with Becky Colan S, MD In 2 weeks.   Specialty:  Neurosurgery   Contact information:   [address] [place] [address] [phone]        Signed: Tia Alert 07/07/2014, 7:58 AM

## 2014-07-10 ENCOUNTER — Encounter (HOSPITAL_COMMUNITY): Payer: Self-pay | Admitting: Neurological Surgery

## 2014-08-24 ENCOUNTER — Telehealth: Payer: Self-pay | Admitting: Neurology

## 2014-08-24 NOTE — Telephone Encounter (Signed)
Pt canceled appt to see Dr Allena KatzPatel on 08-30-14 and will call back to resch

## 2014-08-25 ENCOUNTER — Ambulatory Visit: Payer: BC Managed Care – PPO | Admitting: Neurology

## 2014-08-30 ENCOUNTER — Ambulatory Visit: Payer: Self-pay | Admitting: Neurology

## 2016-02-07 DIAGNOSIS — Z125 Encounter for screening for malignant neoplasm of prostate: Secondary | ICD-10-CM | POA: Diagnosis not present

## 2016-02-07 DIAGNOSIS — E785 Hyperlipidemia, unspecified: Secondary | ICD-10-CM | POA: Diagnosis not present

## 2016-02-07 DIAGNOSIS — T561X1D Toxic effect of mercury and its compounds, accidental (unintentional), subsequent encounter: Secondary | ICD-10-CM | POA: Diagnosis not present

## 2016-02-07 DIAGNOSIS — E291 Testicular hypofunction: Secondary | ICD-10-CM | POA: Diagnosis not present

## 2016-02-07 DIAGNOSIS — I10 Essential (primary) hypertension: Secondary | ICD-10-CM | POA: Diagnosis not present

## 2017-02-12 DIAGNOSIS — Z23 Encounter for immunization: Secondary | ICD-10-CM | POA: Diagnosis not present

## 2017-04-22 DIAGNOSIS — E291 Testicular hypofunction: Secondary | ICD-10-CM | POA: Diagnosis not present

## 2017-04-22 DIAGNOSIS — E7800 Pure hypercholesterolemia, unspecified: Secondary | ICD-10-CM | POA: Diagnosis not present

## 2017-04-22 DIAGNOSIS — Z125 Encounter for screening for malignant neoplasm of prostate: Secondary | ICD-10-CM | POA: Diagnosis not present

## 2017-04-22 DIAGNOSIS — I10 Essential (primary) hypertension: Secondary | ICD-10-CM | POA: Diagnosis not present

## 2017-05-13 ENCOUNTER — Encounter (HOSPITAL_COMMUNITY): Payer: Self-pay | Admitting: Family Medicine

## 2017-05-13 ENCOUNTER — Observation Stay (HOSPITAL_COMMUNITY)
Admission: EM | Admit: 2017-05-13 | Discharge: 2017-05-15 | Disposition: A | Discharge status: Home / Self Care | Payer: BLUE CROSS/BLUE SHIELD | Attending: Internal Medicine | Admitting: Internal Medicine

## 2017-05-13 ENCOUNTER — Emergency Department (HOSPITAL_COMMUNITY): Payer: BLUE CROSS/BLUE SHIELD

## 2017-05-13 DIAGNOSIS — I129 Hypertensive chronic kidney disease with stage 1 through stage 4 chronic kidney disease, or unspecified chronic kidney disease: Secondary | ICD-10-CM | POA: Diagnosis not present

## 2017-05-13 DIAGNOSIS — Z91040 Latex allergy status: Secondary | ICD-10-CM | POA: Insufficient documentation

## 2017-05-13 DIAGNOSIS — Z79899 Other long term (current) drug therapy: Secondary | ICD-10-CM | POA: Diagnosis not present

## 2017-05-13 DIAGNOSIS — J45909 Unspecified asthma, uncomplicated: Secondary | ICD-10-CM | POA: Diagnosis not present

## 2017-05-13 DIAGNOSIS — G459 Transient cerebral ischemic attack, unspecified: Secondary | ICD-10-CM | POA: Diagnosis not present

## 2017-05-13 DIAGNOSIS — R413 Other amnesia: Secondary | ICD-10-CM | POA: Diagnosis not present

## 2017-05-13 DIAGNOSIS — E7220 Disorder of urea cycle metabolism, unspecified: Secondary | ICD-10-CM

## 2017-05-13 DIAGNOSIS — N182 Chronic kidney disease, stage 2 (mild): Secondary | ICD-10-CM | POA: Diagnosis not present

## 2017-05-13 DIAGNOSIS — Z981 Arthrodesis status: Secondary | ICD-10-CM | POA: Diagnosis not present

## 2017-05-13 DIAGNOSIS — G454 Transient global amnesia: Secondary | ICD-10-CM | POA: Diagnosis not present

## 2017-05-13 DIAGNOSIS — E785 Hyperlipidemia, unspecified: Secondary | ICD-10-CM | POA: Diagnosis not present

## 2017-05-13 DIAGNOSIS — I10 Essential (primary) hypertension: Secondary | ICD-10-CM | POA: Diagnosis present

## 2017-05-13 DIAGNOSIS — R9431 Abnormal electrocardiogram [ECG] [EKG]: Secondary | ICD-10-CM | POA: Diagnosis not present

## 2017-05-13 DIAGNOSIS — M4802 Spinal stenosis, cervical region: Secondary | ICD-10-CM | POA: Diagnosis not present

## 2017-05-13 DIAGNOSIS — R29818 Other symptoms and signs involving the nervous system: Secondary | ICD-10-CM | POA: Diagnosis not present

## 2017-05-13 DIAGNOSIS — R4182 Altered mental status, unspecified: Secondary | ICD-10-CM | POA: Diagnosis not present

## 2017-05-13 LAB — COMPREHENSIVE METABOLIC PANEL
ALT: 27 U/L (ref 17–63)
AST: 39 U/L (ref 15–41)
Albumin: 4.1 g/dL (ref 3.5–5.0)
Alkaline Phosphatase: 60 U/L (ref 38–126)
Anion gap: 10 (ref 5–15)
BUN: 26 mg/dL — ABNORMAL HIGH (ref 6–20)
CO2: 27 mmol/L (ref 22–32)
Calcium: 9.4 mg/dL (ref 8.9–10.3)
Chloride: 100 mmol/L — ABNORMAL LOW (ref 101–111)
Creatinine, Ser: 1.4 mg/dL — ABNORMAL HIGH (ref 0.61–1.24)
GFR calc Af Amer: >60 mL/min (ref >60)
GFR calc non Af Amer: 53 mL/min — ABNORMAL LOW (ref >60)
Glucose, Bld: 95 mg/dL (ref 65–99)
Potassium: 3.5 mmol/L (ref 3.5–5.1)
Sodium: 137 mmol/L (ref 135–145)
Total Bilirubin: 0.7 mg/dL (ref 0.3–1.2)
Total Protein: 7.7 g/dL (ref 6.5–8.1)

## 2017-05-13 LAB — DIFFERENTIAL
Basophils Absolute: 0 10*3/uL (ref 0.0–0.1)
Basophils Relative: 0 %
Eosinophils Absolute: 0.6 10*3/uL (ref 0.0–0.7)
Eosinophils Relative: 6 %
Lymphocytes Relative: 35 %
Lymphs Abs: 3.6 10*3/uL (ref 0.7–4.0)
Monocytes Absolute: 1.6 10*3/uL — ABNORMAL HIGH (ref 0.1–1.0)
Monocytes Relative: 16 %
Neutro Abs: 4.5 10*3/uL (ref 1.7–7.7)
Neutrophils Relative %: 43 %

## 2017-05-13 LAB — I-STAT CHEM 8, ED
BUN: 27 mg/dL — ABNORMAL HIGH (ref 6–20)
Calcium, Ion: 1.08 mmol/L — ABNORMAL LOW (ref 1.15–1.40)
Chloride: 101 mmol/L (ref 101–111)
Creatinine, Ser: 1.4 mg/dL — ABNORMAL HIGH (ref 0.61–1.24)
Glucose, Bld: 92 mg/dL (ref 65–99)
HCT: 47 % (ref 39.0–52.0)
Hemoglobin: 16 g/dL (ref 13.0–17.0)
Potassium: 3.5 mmol/L (ref 3.5–5.1)
Sodium: 138 mmol/L (ref 135–145)
TCO2: 26 mmol/L (ref 22–32)

## 2017-05-13 LAB — APTT: aPTT: 29 seconds (ref 24–36)

## 2017-05-13 LAB — I-STAT TROPONIN, ED: Troponin i, poc: 0.01 ng/mL (ref 0.00–0.08)

## 2017-05-13 LAB — PROTIME-INR
INR: 0.94
Prothrombin Time: 12.5 seconds (ref 11.4–15.2)

## 2017-05-13 LAB — CBC
HCT: 45.4 % (ref 39.0–52.0)
Hemoglobin: 15.8 g/dL (ref 13.0–17.0)
MCH: 31.9 pg (ref 26.0–34.0)
MCHC: 34.8 g/dL (ref 30.0–36.0)
MCV: 91.7 fL (ref 78.0–100.0)
Platelets: 254 10*3/uL (ref 150–400)
RBC: 4.95 MIL/uL (ref 4.22–5.81)
RDW: 13.1 % (ref 11.5–15.5)
WBC: 10.4 10*3/uL (ref 4.0–10.5)

## 2017-05-13 LAB — CBG MONITORING, ED
Glucose-Capillary: 84 mg/dL (ref 65–99)
Glucose-Capillary: 86 mg/dL (ref 65–99)

## 2017-05-13 MED ORDER — ASPIRIN 325 MG PO TABS
325.0000 mg | ORAL_TABLET | Freq: Every day | ORAL | Status: DC
  Administered 2017-05-14: 325 mg via ORAL
  Filled 2017-05-13: qty 1

## 2017-05-13 MED ORDER — LOSARTAN POTASSIUM 50 MG PO TABS
50.0000 mg | ORAL_TABLET | Freq: Every day | ORAL | Status: DC
  Administered 2017-05-14 – 2017-05-15 (×2): 50 mg via ORAL
  Filled 2017-05-13 (×2): qty 1

## 2017-05-13 MED ORDER — ACETAMINOPHEN 325 MG PO TABS: 650.0000 mg | ORAL_TABLET | ORAL | Status: DC | PRN

## 2017-05-13 MED ORDER — ACETAMINOPHEN 650 MG RE SUPP: 650.0000 mg | RECTAL | Status: DC | PRN

## 2017-05-13 MED ORDER — OMEGA-3-ACID ETHYL ESTERS 1 G PO CAPS
2.0000 g | ORAL_CAPSULE | Freq: Every day | ORAL | Status: DC
  Administered 2017-05-14 – 2017-05-15 (×2): 2 g via ORAL
  Filled 2017-05-13 (×2): qty 2

## 2017-05-13 MED ORDER — ACETAMINOPHEN 160 MG/5ML PO SOLN: 650.0000 mg | ORAL | Status: DC | PRN

## 2017-05-13 MED ORDER — SODIUM CHLORIDE 0.9 % IV SOLN
INTRAVENOUS | Status: DC
  Administered 2017-05-14: 01:00:00 via INTRAVENOUS
  Administered 2017-05-14: 1000 mL via INTRAVENOUS

## 2017-05-13 MED ORDER — VITAMIN C 500 MG PO TABS
500.0000 mg | ORAL_TABLET | Freq: Every day | ORAL | Status: DC
  Administered 2017-05-14 – 2017-05-15 (×2): 500 mg via ORAL
  Filled 2017-05-13 (×2): qty 1

## 2017-05-13 MED ORDER — VALACYCLOVIR HCL 500 MG PO TABS: 2000.0000 mg | ORAL_TABLET | Freq: Two times a day (BID) | ORAL | Status: DC

## 2017-05-13 MED ORDER — ASPIRIN 300 MG RE SUPP: 300.0000 mg | Freq: Every day | RECTAL | Status: DC

## 2017-05-13 MED ORDER — STROKE: EARLY STAGES OF RECOVERY BOOK
Freq: Once | Status: AC
  Administered 2017-05-14: 01:00:00
  Filled 2017-05-13: qty 1

## 2017-05-13 MED ORDER — ENOXAPARIN SODIUM 40 MG/0.4ML ~~LOC~~ SOLN
40.0000 mg | SUBCUTANEOUS | Status: DC
  Administered 2017-05-14 – 2017-05-15 (×2): 40 mg via SUBCUTANEOUS
  Filled 2017-05-13 (×2): qty 0.4

## 2017-05-13 MED ORDER — THIAMINE HCL 100 MG/ML IJ SOLN
100.0000 mg | Freq: Every day | INTRAMUSCULAR | Status: DC
  Administered 2017-05-14 – 2017-05-15 (×2): 100 mg via INTRAVENOUS
  Filled 2017-05-13 (×2): qty 2

## 2017-05-13 MED ORDER — ALBUTEROL SULFATE (2.5 MG/3ML) 0.083% IN NEBU: 3.0000 mL | INHALATION_SOLUTION | Freq: Four times a day (QID) | RESPIRATORY_TRACT | Status: DC | PRN

## 2017-05-13 NOTE — ED Notes (Signed)
Patient came in with spouse reporting he could not remember anything. Patients spouse reports they were walking and departed at 18:20 where he was at his baseline. When patients spouse returned at 18:50, he could remember any details. Denies any slurring speech, numbness/tingling, or extremities drifts. When entering the triage room, patient was flushed and appeared upset.

## 2017-05-13 NOTE — ED Provider Notes (Signed)
[facility]-EMERGENCY DEPT Provider Note   CSN: [id number] Arrival date & time: 05/13/17  1904     History   Chief Complaint Chief Complaint  Patient presents with  . Altered Mental Status    HPI Daniel Rush is a 61 y.o. male.  HPI   Past medical history significant for hypertension the presents today with confusion.  Patient was on walk with his wife.  He returned back with his dogs the house.  His wife returned patient had no idea where he was or how he got there.  He does not member any details of the day.  He does not remember the president is. Or what happened over the last week or so.  He is able to remember his wife's name and that he has children.  And recognize the dogs.  Past Medical History:  Diagnosis Date  . Arthritis    knees   . Asthma    rare use of an inhaler   . Hypertension   . Neuromuscular disorder (HCC)    spondylosis     Patient Active Problem List   Diagnosis Date Noted  . S/P cervical spinal fusion 07/06/2014  . Sinus bradycardia 02/01/2013  . Hypertension, essential 01/31/2013  . Atypical chest pain 01/31/2013    Past Surgical History:  Procedure Laterality Date  . ANTERIOR CERVICAL DECOMP/DISCECTOMY FUSION N/A 07/06/2014   Procedure: ANTERIOR CERVICAL DECOMPRESSION/DISCECTOMY FUSION 3 LEVELS;  Surgeon: Tia Alert, MD;  Location: [facility];  Service: Neurosurgery;  Laterality: N/A;  ANTERIOR CERVICAL DECOMPRESSION/DISCECTOMY FUSION 3 LEVELS  . CERVICAL DISC SURGERY  1999  . KNEE SURGERY Bilateral    X2 on R , x1 surg. on L - for ACL, & meniscus  . NECK SURGERY     C4-C5       Home Medications    Prior to Admission medications   Medication Sig Start Date End Date Taking? Authorizing Provider  albuterol (PROVENTIL HFA;VENTOLIN HFA) 108 (90 BASE) MCG/ACT inhaler Inhale into the lungs every 6 (six) hours as needed for wheezing or shortness of breath.    [provider]  ANDROGEL PUMP 20.25  MG/ACT (1.62%) GEL Apply 2 Act topically daily.  04/17/14   [provider]  CHONDROITIN SULFATE PO Take 1 tablet by mouth 2 (two) times daily.    [provider]  Cyanocobalamin (VITAMIN B-12 PO) Take 1 tablet by mouth daily.     [provider]  gabapentin (NEURONTIN) 100 MG capsule Take 3 capsules (300 mg total) by mouth at bedtime. Patient taking differently: Take 300 mg by mouth daily as needed.  05/25/14   Nita Sickle K, DO  HYDROcodone-acetaminophen (NORCO/VICODIN) 5-325 MG per tablet Take 1-2 tablets by mouth every 6 (six) hours as needed (mild pain). 07/07/14   Tia Alert, MD  LECITHIN PO Take 1 capsule by mouth daily.    [provider]  losartan-hydrochlorothiazide (HYZAAR) 50-12.5 MG per tablet Take 1 tablet by mouth daily.    [provider]  methocarbamol (ROBAXIN) 500 MG tablet Take 1 tablet (500 mg total) by mouth every 6 (six) hours as needed for muscle spasms. 07/07/14   Tia Alert, MD  Multiple Vitamins-Minerals (MULTIVITAMIN WITH MINERALS) tablet Take 1 tablet by mouth daily.    [provider]  Omega-3 Fatty Acids (FISH OIL PO) Take 1 capsule by mouth daily.    [provider]  ST JOHNS WORT PO Take 1 tablet by mouth daily.  [provider]  vitamin C (ASCORBIC ACID) 500 MG tablet Take 500 mg by mouth daily.    [provider]  VITAMIN E PO Take 1 capsule by mouth daily.     [provider]    Family History Family History  Problem Relation Age of Onset  . Alzheimer's disease Mother        Deceased, 79  . Hypertension Mother   . Heart failure Father        Deceased, 56  . Hypertension Father   . Hyperlipidemia Brother   . Healthy Daughter   . Healthy Son     Social History Social History  Substance Use Topics  . Smoking status: Never Smoker  . Smokeless tobacco: Never Used  . Alcohol use 1.2 oz/week    2 Glasses of wine per week     Comment: daily with dinner  1 glass of wine     Allergies   Latex   Review of Systems Review of Systems  Constitutional: Negative for activity change.  Respiratory: Negative for shortness of breath.   Cardiovascular: Negative for chest pain.  Gastrointestinal: Negative for abdominal pain.  Psychiatric/Behavioral: Positive for confusion. Negative for agitation, behavioral problems and hallucinations.  All other systems reviewed and are negative.    Physical Exam Updated Vital Signs BP (!) 162/95 (BP Location: Right Arm)   Pulse (!) 59   Temp (!) 97.5 F (36.4 C) (Oral)   Resp 20   Ht 5\' 9"  (1.753 m)   Wt 68 kg (150 lb)   SpO2 100%   BMI 22.15 kg/m   Physical Exam  Constitutional: He appears well-nourished.  HENT:  Head: Normocephalic.  Eyes: Conjunctivae are normal. Right eye exhibits no discharge. Left eye exhibits no discharge.  Cardiovascular: Normal rate and regular rhythm.   No murmur heard. Pulmonary/Chest: Effort normal and breath sounds normal. No respiratory distress.  Abdominal: Soft. He exhibits no distension. There is no tenderness.  Neurological: No cranial nerve deficit. Coordination normal.  Equal strength bilaterally upper and lower extremities negative pronator drift. Normal sensation bilaterally. Speech comprehensible, no slurring. Facial nerve tested and appears grossly normal. Alert and oriented 2.   Skin: Skin is warm and dry. He is not diaphoretic.  Psychiatric: He has a normal mood and affect. His behavior is normal.     ED Treatments / Results  Labs (all labs ordered are listed, but only abnormal results are displayed) Labs Reviewed  DIFFERENTIAL - Abnormal; Notable for the following:       Result Value   Monocytes Absolute 1.6 (*)    All other components within normal limits  CBC  PROTIME-INR  APTT  COMPREHENSIVE METABOLIC PANEL  I-STAT TROPONIN, ED  CBG MONITORING, ED  I-STAT CHEM 8, ED    EKG  EKG Interpretation  Date/Time:  Wednesday May 13 2017 19:25:34 EDT Ventricular Rate:  63 PR Interval:    QRS Duration: 92 QT Interval:  415 QTC Calculation: 425 R Axis:   81 Text Interpretation:  Sinus rhythm Probable left atrial enlargement Borderline right axis deviation Left ventricular hypertrophy Nonspecific T abnrm, anterolateral leads ST elevation, consider anterior injury No significant change since last tracing Confirmed by Corlis Leak, Rabia Argote ([id number]) on 05/13/2017 7:48:28 PM       Radiology No results found.  Procedures Procedures (including critical care time)  Medications Ordered in ED Medications - No data to display   Initial Impression / Assessment and Plan / ED Course  I have  reviewed the triage vital signs and the nursing notes.  Pertinent labs & imaging results that were available during my care of the patient were reviewed by me and considered in my medical decision making (see chart for details).     Past medical history significant for hypertension the presents today with confusion.  Patient was on walk with his wife.  He returned back with his dogs the house.  His wife returned patient had no idea where he was or how he got there.  He does not member any details of the day.  He does not remember the president is. Or what happened over the last week or so.  He is able to remember his wife's name and that he has children.  And recognize the dogs.   7:48 PM Canceled code stroke.  Patient has no weakness numbness dizziness or cranial nerve deficit.  Just amnesia.  Patient's repetitive in asking questions.  Suspect [facility]. Will get stat head CT and touch base with neurology.   8:29 PM Neurology stating that they are not sure they can really call [facility] without stat MRI.  Will call code stroke [facility].  Discussed transfer with Dr. Jacqulyn BathLong  Final Clinical Impressions(s) / ED Diagnoses   Final diagnoses:  None    New Prescriptions New Prescriptions   No medications on  file     Abelino DerrickMackuen, Litisha Guagliardo Lyn, MD 05/13/17 2030

## 2017-05-13 NOTE — Consult Note (Signed)
Requesting Physician: Dr. Corlis Leak     Chief Complaint: Acute confusion  History obtained from:   Patient and Chart    HPI:                                                                                                                                       Daniel Rush is an 61 y.o. male with past medical history of hypertension presents with sudden onset confusion while walking his dog.  According to his wife around 6:50 PM he returned from his walk the patient could not remember any details.  He was confused and disoriented to place, time and current events.  Is brought to [facility] emergency room and neurology was consulted over the phone by the ER physician was concerned this patient was having a transient global amnesia.  CT head was done which was normal.  She was transferred to [facility] ER as a stroke alert.  The patient was improving and was oriented to place and reason why he was in the hospital.  He could not state the month (November instead of October). He was able to state his age, wife's name.  He was able to read without difficulty.  NIH stroke scale was 1.  Stat MRI was performed which is negative for acute stroke.   Date last known well: 10.24.18 Time last known well: 6.20 p.m.   Past Medical History:  Diagnosis Date  . Arthritis    knees   . Asthma    rare use of an inhaler   . Hypertension   . Neuromuscular disorder (HCC)    spondylosis     Past Surgical History:  Procedure Laterality Date  . ANTERIOR CERVICAL DECOMP/DISCECTOMY FUSION N/A 07/06/2014   Procedure: ANTERIOR CERVICAL DECOMPRESSION/DISCECTOMY FUSION 3 LEVELS;  Surgeon: Tia Alert, MD;  Location: [facility];  Service: Neurosurgery;  Laterality: N/A;  ANTERIOR CERVICAL DECOMPRESSION/DISCECTOMY FUSION 3 LEVELS  . CERVICAL DISC SURGERY  1999  . KNEE SURGERY Bilateral    X2 on R , x1 surg. on L - for ACL, & meniscus  . NECK SURGERY     C4-C5    Family History  Problem Relation Age of Onset  .  Alzheimer's disease Mother        Deceased, 80  . Hypertension Mother   . Heart failure Father        Deceased, 108  . Hypertension Father   . Hyperlipidemia Brother   . Healthy Daughter   . Healthy Son    Social History:  reports that he has never smoked. He has never used smokeless tobacco. He reports that he drinks about 1.2 oz of alcohol per week . He reports that he does not use drugs.  Allergies:  Allergies  Allergen Reactions  . Latex Rash    Medications:  I reviewed home medications   ROS:                                                                                                                                     14 systems reviewed and negative except above   Examination:                                                                                                      General: Appears well-developed and well-nourished.  Psych: Anxious Eyes: No scleral injection HENT: No OP obstrucion Head: Normocephalic.  Cardiovascular: Normal rate and regular rhythm.  Respiratory: Effort normal and breath sounds normal to anterior ascultation GI: Soft.  No distension. There is no tenderness.  Skin: WDI   Neurological Examination Mental Status: Alert, oriented to place person.  Slightly confused as to time and recent events. Recent memory impaired. Speech fluent without evidence of aphasia.  Able to follow 3 step commands without difficulty.  Cranial Nerves: II: Discs flat bilaterally; Visual fields grossly normal,  III,IV, VI: ptosis not present, extra-ocular motions intact bilaterally, pupils equal, round, reactive to light and accommodation V,VII: smile symmetric, facial light touch sensation normal bilaterally VIII: hearing normal bilaterally IX,X: uvula rises symmetrically XI: bilateral shoulder shrug XII: midline tongue extension Motor: Right  : Upper extremity   5/5    Left:     Upper extremity   5/5  Lower extremity   5/5     Lower extremity   5/5 Tone and bulk:normal tone throughout; no atrophy noted Sensory: Pinprick and light touch intact throughout, bilaterally Deep Tendon Reflexes: 2+ and symmetric throughout Plantars: Right: downgoing   Left: downgoing Cerebellar: normal finger-to-nose, normal rapid alternating movements and normal heel-to-shin test Gait: normal gait and station     Lab Results: Basic Metabolic Panel:  Recent Labs Lab 05/13/17 1928 05/13/17 1943  NA 137 138  K 3.5 3.5  CL 100* 101  CO2 27  --   GLUCOSE 95 92  BUN 26* 27*  CREATININE 1.40* 1.40*  CALCIUM 9.4  --     CBC:  Recent Labs Lab 05/13/17 1928 05/13/17 1943  WBC 10.4  --   NEUTROABS 4.5  --   HGB 15.8 16.0  HCT 45.4 47.0  MCV 91.7  --   PLT 254  --     Coagulation Studies:  Recent Labs  05/13/17 1928  LABPROT 12.5  INR 0.94    Imaging: Mr Maxine GlennMra Head Wo Contrast  Result Date: 05/13/2017  CLINICAL DATA:  Initial evaluation for acute memory loss. EXAM: MRI HEAD WITHOUT CONTRAST MRA HEAD WITHOUT CONTRAST TECHNIQUE: Multiplanar, multiecho pulse sequences of the brain and surrounding structures were obtained without intravenous contrast. Angiographic images of the head were obtained using MRA technique without contrast. COMPARISON:  Prior CT from earlier same day. FINDINGS: MRI HEAD FINDINGS Brain: Study degraded by motion artifact. Cerebral volume within normal limits for age. No significant cerebral white matter disease. No abnormal foci of restricted diffusion to suggest acute or subacute ischemia. Gray-white matter differentiation well maintained. No encephalomalacia to suggest chronic infarction. No evidence for acute or chronic intracranial hemorrhage. No mass lesion, midline shift or mass effect. No hydrocephalus. No extra-axial fluid collection. Major dural sinuses grossly patent. Pituitary gland suprasellar region  normal. Midline structures intact and normal. Vascular: Major intravascular flow voids maintained. Skull and upper cervical spine: Craniocervical junction normal. Bone marrow signal intensity within normal limits. No scalp soft tissue abnormality. Sinuses/Orbits: Globes and orbital soft tissues within normal limits. Other: Moderate mucosal thickening within the ethmoidal air cells, left sphenoid sinus, and maxillary sinuses. No air-fluid level to suggest acute sinusitis. Trace bilateral mastoid effusions, right slightly larger than left, of doubtful significance. Inner ear structures normal. MRA HEAD FINDINGS ANTERIOR CIRCULATION: Study degraded by motion artifact. Distal cervical segments of the internal carotid arteries are patent with antegrade flow. Petrous, cavernous, and supraclinoid segments patent without hemodynamically significant stenosis. A1 segments patent bilaterally. Anterior communicating artery not well evaluated due to motion. Anterior cerebral arteries grossly patent to their distal aspects without obvious stenosis. Patent M1 segments without definite stenosis, although evaluation limited by motion. Distal MCA branches grossly symmetric and well perfused. POSTERIOR CIRCULATION: Vertebral arteries patent to the vertebrobasilar junction without stenosis. Left vertebral artery dominant. Posterior inferior cerebral arteries patent proximally. Basilar artery widely patent to its distal aspect. Superior cerebral arteries patent bilaterally. Both of the posterior cerebral arteries appear to be supplied via the basilar and are grossly perfused to their distal aspects, although evaluation limited by motion. IMPRESSION: MRI HEAD IMPRESSION: 1. Normal brain MRI for age. No acute intracranial abnormality identified. 2. Moderate paranasal sinus disease as above, likely allergic/inflammatory in nature. MRA HEAD IMPRESSION: 1. Motion degraded exam. 2. Grossly normal intracranial MRA. No large vessel occlusion.  No obvious high-grade or correctable stenosis. Electronically Signed   By: Rise Mu M.D.   On: 05/13/2017 22:13   Mr Brain Wo Contrast  Result Date: 05/13/2017 CLINICAL DATA:  Initial evaluation for acute memory loss. EXAM: MRI HEAD WITHOUT CONTRAST MRA HEAD WITHOUT CONTRAST TECHNIQUE: Multiplanar, multiecho pulse sequences of the brain and surrounding structures were obtained without intravenous contrast. Angiographic images of the head were obtained using MRA technique without contrast. COMPARISON:  Prior CT from earlier same day. FINDINGS: MRI HEAD FINDINGS Brain: Study degraded by motion artifact. Cerebral volume within normal limits for age. No significant cerebral white matter disease. No abnormal foci of restricted diffusion to suggest acute or subacute ischemia. Gray-white matter differentiation well maintained. No encephalomalacia to suggest chronic infarction. No evidence for acute or chronic intracranial hemorrhage. No mass lesion, midline shift or mass effect. No hydrocephalus. No extra-axial fluid collection. Major dural sinuses grossly patent. Pituitary gland suprasellar region normal. Midline structures intact and normal. Vascular: Major intravascular flow voids maintained. Skull and upper cervical spine: Craniocervical junction normal. Bone marrow signal intensity within normal limits. No scalp soft tissue abnormality. Sinuses/Orbits: Globes and orbital soft tissues within normal limits. Other: Moderate mucosal thickening within the ethmoidal  air cells, left sphenoid sinus, and maxillary sinuses. No air-fluid level to suggest acute sinusitis. Trace bilateral mastoid effusions, right slightly larger than left, of doubtful significance. Inner ear structures normal. MRA HEAD FINDINGS ANTERIOR CIRCULATION: Study degraded by motion artifact. Distal cervical segments of the internal carotid arteries are patent with antegrade flow. Petrous, cavernous, and supraclinoid segments patent  without hemodynamically significant stenosis. A1 segments patent bilaterally. Anterior communicating artery not well evaluated due to motion. Anterior cerebral arteries grossly patent to their distal aspects without obvious stenosis. Patent M1 segments without definite stenosis, although evaluation limited by motion. Distal MCA branches grossly symmetric and well perfused. POSTERIOR CIRCULATION: Vertebral arteries patent to the vertebrobasilar junction without stenosis. Left vertebral artery dominant. Posterior inferior cerebral arteries patent proximally. Basilar artery widely patent to its distal aspect. Superior cerebral arteries patent bilaterally. Both of the posterior cerebral arteries appear to be supplied via the basilar and are grossly perfused to their distal aspects, although evaluation limited by motion. IMPRESSION: MRI HEAD IMPRESSION: 1. Normal brain MRI for age. No acute intracranial abnormality identified. 2. Moderate paranasal sinus disease as above, likely allergic/inflammatory in nature. MRA HEAD IMPRESSION: 1. Motion degraded exam. 2. Grossly normal intracranial MRA. No large vessel occlusion. No obvious high-grade or correctable stenosis. Electronically Signed   By: Rise Mu M.D.   On: 05/13/2017 22:13   Ct Head Code Stroke Wo Contrast  Result Date: 05/13/2017 CLINICAL DATA:  Code stroke. Initial evaluation for acute altered mental status. EXAM: CT HEAD WITHOUT CONTRAST TECHNIQUE: Contiguous axial images were obtained from the base of the skull through the vertex without intravenous contrast. COMPARISON:  None available. FINDINGS: Brain: Cerebral volume within normal limits for patient age. No evidence for acute intracranial hemorrhage. No findings to suggest acute large vessel territory infarct. No mass lesion, midline shift, or mass effect. Ventricles are normal in size without evidence for hydrocephalus. No extra-axial fluid collection identified. Vascular: No hyperdense  vessel identified. Skull: Scalp soft tissues demonstrate no acute abnormality.Calvarium intact. Sinuses/Orbits: Globes and orbital soft tissues are within normal limits. Mucosal thickening seen within the ethmoidal air cells bilaterally as well as the left sphenoid and visualized left maxillary sinus. No air-fluid level to suggest acute sinusitis. Trace bilateral mastoid effusions ASPECTS (Alberta Stroke Program Early CT Score) - Ganglionic level infarction (caudate, lentiform nuclei, internal capsule, insula, M1-M3 cortex): 7 - Supraganglionic infarction (M4-M6 cortex): 3 Total score (0-10 with 10 being normal): 10 IMPRESSION: 1. Normal head CT.  No acute intracranial abnormality identified. 2. ASPECTS is 10. Critical Value/emergent results were called by telephone at the time of interpretation on 05/13/2017 at 8:26 pm to Dr. Bary Castilla , who verbally acknowledged these results. Electronically Signed   By: Rise Mu M.D.   On: 05/13/2017 20:27     ASSESSMENT AND PLAN  61 y.o. male with past medical history of hypertension presents with sudden onset confusion, antegrade amnesia presents to ER. MRI negative for acute stroke and MRA shows no LVO.   Global amnesia  Recommend # Carotid US # EEG #Urine drug screen  #Transthoracic Echo  # Start patient on ASA 325mg  daily #Start or continue Atorvastatin 80 mg/other high intensity statin # BP goal: permissive HTN upto 210 systolic, PRNs above 21 # HBAIC and Lipid profile # Telemetry monitoring # Frequent neuro checks # NPO until passes stroke swallow screen  Please page stroke NP  Or  PA  Or MD from 8am -4 pm  as this patient from this time will  be  followed by the stroke.   You can look them up on [email or web address]  Password [facility]   Sushanth Aroor [facility] Pager Number [phone]

## 2017-05-13 NOTE — ED Notes (Signed)
Attempted report x1, Charge RN stated she was not aware of pt coming up to floor. Made aware bed assigned for . # left, awaiting callback.

## 2017-05-14 ENCOUNTER — Observation Stay (HOSPITAL_BASED_OUTPATIENT_CLINIC_OR_DEPARTMENT_OTHER): Payer: BLUE CROSS/BLUE SHIELD

## 2017-05-14 ENCOUNTER — Observation Stay (HOSPITAL_COMMUNITY): Payer: BLUE CROSS/BLUE SHIELD

## 2017-05-14 ENCOUNTER — Encounter: Payer: Self-pay | Admitting: Neurology

## 2017-05-14 DIAGNOSIS — I10 Essential (primary) hypertension: Secondary | ICD-10-CM | POA: Diagnosis not present

## 2017-05-14 DIAGNOSIS — G459 Transient cerebral ischemic attack, unspecified: Secondary | ICD-10-CM | POA: Diagnosis not present

## 2017-05-14 DIAGNOSIS — I5030 Unspecified diastolic (congestive) heart failure: Secondary | ICD-10-CM

## 2017-05-14 DIAGNOSIS — M4802 Spinal stenosis, cervical region: Secondary | ICD-10-CM | POA: Diagnosis not present

## 2017-05-14 DIAGNOSIS — G454 Transient global amnesia: Secondary | ICD-10-CM

## 2017-05-14 DIAGNOSIS — E785 Hyperlipidemia, unspecified: Secondary | ICD-10-CM

## 2017-05-14 DIAGNOSIS — E7220 Disorder of urea cycle metabolism, unspecified: Secondary | ICD-10-CM

## 2017-05-14 DIAGNOSIS — R413 Other amnesia: Secondary | ICD-10-CM | POA: Diagnosis not present

## 2017-05-14 LAB — ECHOCARDIOGRAM COMPLETE
Height: 69 in
Weight: 2459.2 oz

## 2017-05-14 LAB — BASIC METABOLIC PANEL
Anion gap: 7 (ref 5–15)
BUN: 15 mg/dL (ref 6–20)
CO2: 26 mmol/L (ref 22–32)
Calcium: 9.2 mg/dL (ref 8.9–10.3)
Chloride: 105 mmol/L (ref 101–111)
Creatinine, Ser: 1.03 mg/dL (ref 0.61–1.24)
GFR calc Af Amer: >60 mL/min (ref >60)
GFR calc non Af Amer: >60 mL/min (ref >60)
Glucose, Bld: 100 mg/dL — ABNORMAL HIGH (ref 65–99)
Potassium: 4.1 mmol/L (ref 3.5–5.1)
Sodium: 138 mmol/L (ref 135–145)

## 2017-05-14 LAB — HIV ANTIBODY (ROUTINE TESTING W REFLEX): HIV Screen 4th Generation wRfx: NONREACTIVE

## 2017-05-14 LAB — CBC
HCT: 43.1 % (ref 39.0–52.0)
Hemoglobin: 15.2 g/dL (ref 13.0–17.0)
MCH: 32 pg (ref 26.0–34.0)
MCHC: 35.3 g/dL (ref 30.0–36.0)
MCV: 90.7 fL (ref 78.0–100.0)
Platelets: 228 10*3/uL (ref 150–400)
RBC: 4.75 MIL/uL (ref 4.22–5.81)
RDW: 13 % (ref 11.5–15.5)
WBC: 8.9 10*3/uL (ref 4.0–10.5)

## 2017-05-14 LAB — AMMONIA
Ammonia: 62 umol/L — ABNORMAL HIGH (ref 9–35)
Ammonia: 44 umol/L — ABNORMAL HIGH (ref 9–35)

## 2017-05-14 LAB — CREATININE, SERUM
Creatinine, Ser: 1.1 mg/dL (ref 0.61–1.24)
GFR calc Af Amer: >60 mL/min (ref >60)
GFR calc non Af Amer: >60 mL/min (ref >60)

## 2017-05-14 LAB — LIPID PANEL
Cholesterol: 198 mg/dL (ref 0–200)
HDL: 72 mg/dL (ref >40)
LDL Cholesterol: 111 mg/dL — ABNORMAL HIGH (ref 0–99)
Total CHOL/HDL Ratio: 2.8 RATIO
Triglycerides: 76 mg/dL (ref <150)
VLDL: 15 mg/dL (ref 0–40)

## 2017-05-14 LAB — HEMOGLOBIN A1C
Hgb A1c MFr Bld: 5.2 % (ref 4.8–5.6)
Mean Plasma Glucose: 102.54 mg/dL

## 2017-05-14 MED ORDER — ASPIRIN EC 81 MG PO TBEC
81.0000 mg | DELAYED_RELEASE_TABLET | Freq: Every day | ORAL | Status: DC
  Administered 2017-05-15: 81 mg via ORAL
  Filled 2017-05-14: qty 1

## 2017-05-14 MED ORDER — IOPAMIDOL (ISOVUE-370) INJECTION 76%
INTRAVENOUS | Status: AC
  Administered 2017-05-14: 50 mL via INTRAVENOUS
  Filled 2017-05-14: qty 50

## 2017-05-14 MED ORDER — ATORVASTATIN CALCIUM 10 MG PO TABS
20.0000 mg | ORAL_TABLET | Freq: Every day | ORAL | Status: DC
  Administered 2017-05-14: 20 mg via ORAL
  Filled 2017-05-14: qty 2

## 2017-05-14 MED ORDER — LACTULOSE 10 GM/15ML PO SOLN
30.0000 g | Freq: Two times a day (BID) | ORAL | Status: DC
  Administered 2017-05-14 – 2017-05-15 (×3): 30 g via ORAL
  Filled 2017-05-14 (×4): qty 45

## 2017-05-14 MED ORDER — INFLUENZA VAC SPLIT QUAD 0.5 ML IM SUSY: 0.5000 mL | PREFILLED_SYRINGE | INTRAMUSCULAR | Status: DC

## 2017-05-14 NOTE — Evaluation (Signed)
Physical Therapy Evaluation & Discharge Patient Details Name: Daniel Rush MRN: [id number] DOB: 1956/05/03 Today's Date: 05/14/2017   History of Present Illness  Pt is a 61 y/o male admitted secondary to acute confusion. MRI was normal. PMH including but not limited to asthma, HTN and ACDF in 2015.  Clinical Impression  Pt presented supine in bed with HOB elevated, awake and willing to participate in therapy session. Prior to admission, pt reported that he was independent with all functional mobility, ADLs and works full-time in Production manager. Pt ambulated in hallway without use of an AD with no instability or LOB, supervision for safety. Pt also participated in a higher level balance assessment. He scored a 24/24 on the DGI indicating he is a safe Tourist information centre manager. Pt only concerned with his loss of memory and understanding what happened to him. No further acute PT needs identified at this time. PT signing off.    Follow Up Recommendations No PT follow up    Equipment Recommendations  None recommended by PT    Recommendations for Other Services       Precautions / Restrictions Precautions Precautions: None Restrictions Weight Bearing Restrictions: No      Mobility  Bed Mobility Overal bed mobility: Independent                Transfers Overall transfer level: Independent Equipment used: None                Ambulation/Gait Ambulation/Gait assistance: Supervision Ambulation Distance (Feet): 400 Feet Assistive device: None Gait Pattern/deviations: Step-through pattern;WFL(Within Functional Limits) Gait velocity: WFL Gait velocity interpretation: at or above normal speed for age/gender General Gait Details: no abnormalities or difficulty with ambulation; pt able to multitask while ambulating with no issues. No LOB or need for physical assistance  Stairs            Wheelchair Mobility    Modified Rankin (Stroke Patients Only)       Balance  Overall balance assessment: Independent                               Standardized Balance Assessment Standardized Balance Assessment : Dynamic Gait Index   Dynamic Gait Index Level Surface: Normal Change in Gait Speed: Normal Gait with Horizontal Head Turns: Normal Gait with Vertical Head Turns: Normal Gait and Pivot Turn: Normal Step Over Obstacle: Normal Step Around Obstacles: Normal Steps: Normal Total Score: 24       Pertinent Vitals/Pain Pain Assessment: No/denies pain    Home Living Family/patient expects to be discharged to:: Private residence Living Arrangements: Spouse/significant other Available Help at Discharge: Family Type of Home: House Home Access: Stairs to enter Entrance Stairs-Rails: Doctor, general practice of Steps: 4 Home Layout: Two level Home Equipment: None      Prior Function Level of Independence: Independent         Comments: works full-time      Higher education careers adviser        Extremity/Trunk Assessment   Upper Extremity Assessment Upper Extremity Assessment: Defer to OT evaluation    Lower Extremity Assessment Lower Extremity Assessment: Overall WFL for tasks assessed    Cervical / Trunk Assessment Cervical / Trunk Assessment: Other exceptions Cervical / Trunk Exceptions: hx of ACDF, decreased AROM cervical rotation  Communication   Communication: No difficulties  Cognition Arousal/Alertness: Awake/alert Behavior During Therapy: WFL for tasks assessed/performed Overall Cognitive Status: Impaired/Different from baseline Area of Impairment: Memory  Memory: Decreased short-term memory         General Comments: pt unable to recall events of last night or how he got to the hospital. pt also has difficulty remembering this morning.      General Comments      Exercises     Assessment/Plan    PT Assessment Patent does not need any further PT services  PT Problem List          PT Treatment Interventions      PT Goals (Current goals can be found in the Care Plan section)  Acute Rehab PT Goals Patient Stated Goal: understand what happened and why    Frequency     Barriers to discharge        Co-evaluation               AM-PAC PT "6 Clicks" Daily Activity  Outcome Measure Difficulty turning over in bed (including adjusting bedclothes, sheets and blankets)?: None Difficulty moving from lying on back to sitting on the side of the bed? : None Difficulty sitting down on and standing up from a chair with arms (e.g., wheelchair, bedside commode, etc,.)?: None Help needed moving to and from a bed to chair (including a wheelchair)?: None Help needed walking in hospital room?: None Help needed climbing 3-5 steps with a railing? : None 6 Click Score: 24    End of Session   Activity Tolerance: Patient tolerated treatment well Patient left: in bed;with call bell/phone within reach Nurse Communication: Mobility status PT Visit Diagnosis: Other symptoms and signs involving the nervous system (R29.898)    Time: 7829-56210813-0830 PT Time Calculation (min) (ACUTE ONLY): 17 min   Charges:   PT Evaluation $PT Eval Moderate Complexity: 1 Mod     PT G Codes:   PT G-Codes **NOT FOR INPATIENT CLASS** Functional Assessment Tool Used: AM-PAC 6 Clicks Basic Mobility;Clinical judgement Functional Limitation: Mobility: Walking and moving around Mobility: Walking and Moving Around Current Status (H0865(G8978): 0 percent impaired, limited or restricted Mobility: Walking and Moving Around Goal Status (H8469(G8979): 0 percent impaired, limited or restricted Mobility: Walking and Moving Around Discharge Status (G2952(G8980): 0 percent impaired, limited or restricted    [facility] CenterJennifer Lantz Hermann, PT, DPT [phone]   Alessandra BevelsJennifer M Dawn Kiper 05/14/2017, 9:26 AM

## 2017-05-14 NOTE — Progress Notes (Signed)
Pt admitted from ED with stroke like symptoms,  Alert and oriented but slightly forgetful at times, pt settled in bed with call light at bedside, tele monitor put and verified on pt, denies any discomfort, pt and family was however reassured, will continue to monitor. Obasogie-Asidi, Kanetra Ho Efe

## 2017-05-14 NOTE — H&P (Signed)
History and Physical    Daniel CancelDouglas C Deguia Rush:[id number]:[id number] DOB: Dec 07, 1955 DOA: 05/13/2017  PCP: System, Provider Not In  Patient coming from: Home.  Chief Complaint: Confusion.  HPI: Daniel Rush is a 61 y.o. male with history of hypertension, asthma was brought to the ER after patient's wife found that patient was having some memory loss.  Last evening at around 5 PM patient had gone for walking with his wife and his dog.  Patient's wife continue to walk but patient went back to his house with his dog.  When patient's wife arrived back home patient was found to be confused and was not able to recall recent incidents.  Patient did not have any loss of consciousness headache visual symptoms and difficulty speaking swallowing or any weakness of the extremities.  ED Course: In the ER MRI of the brain and MRA of the brain were unremarkable.  Neurology on-call was consulted and patient is being admitted for further observation.  Review of Systems: As per HPI, rest all negative.   Past Medical History:  Diagnosis Date  . Arthritis    knees   . Asthma    rare use of an inhaler   . Hypertension   . Neuromuscular disorder (HCC)    spondylosis     Past Surgical History:  Procedure Laterality Date  . ANTERIOR CERVICAL DECOMP/DISCECTOMY FUSION N/A 07/06/2014   Procedure: ANTERIOR CERVICAL DECOMPRESSION/DISCECTOMY FUSION 3 LEVELS;  Surgeon: Tia Alertavid S Jones, MD;  Location: [facility];  Service: Neurosurgery;  Laterality: N/A;  ANTERIOR CERVICAL DECOMPRESSION/DISCECTOMY FUSION 3 LEVELS  . CERVICAL DISC SURGERY  1999  . KNEE SURGERY Bilateral    X2 on R , x1 surg. on L - for ACL, & meniscus  . NECK SURGERY     C4-C5     reports that he has never smoked. He has never used smokeless tobacco. He reports that he drinks about 1.2 oz of alcohol per week . He reports that he does not use drugs.  Allergies  Allergen Reactions  . Latex Rash    Family History  Problem Relation Age of Onset    . Alzheimer's disease Mother        Deceased, 6382  . Hypertension Mother   . Heart failure Father        Deceased, 7477  . Hypertension Father   . Hyperlipidemia Brother   . Healthy Daughter   . Healthy Son     Prior to Admission medications   Medication Sig Start Date End Date Taking? Authorizing Provider  ANDROGEL PUMP 20.25 MG/ACT (1.62%) GEL Apply 2 Act topically daily.  04/17/14  Yes [provider]  Cyanocobalamin (VITAMIN B-12 PO) Take 1 tablet by mouth daily.    Yes [provider]  losartan-hydrochlorothiazide (HYZAAR) 50-12.5 MG per tablet Take 1 tablet by mouth daily.   Yes [provider]  Multiple Vitamins-Minerals (MULTIVITAMIN WITH MINERALS) tablet Take 1 tablet by mouth daily.   Yes [provider]  Omega-3 Fatty Acids (FISH OIL PO) Take 1 capsule by mouth daily.   Yes [provider]  valACYclovir (VALTREX) 1000 MG tablet Take 2,000 mg by mouth every 12 (twelve) hours. 05/05/17  Yes [provider]  vitamin C (ASCORBIC ACID) 500 MG tablet Take 500 mg by mouth daily.   Yes [provider]  VITAMIN E PO Take 1 capsule by mouth daily.    Yes [provider]  albuterol (PROVENTIL HFA;VENTOLIN HFA) 108 (90 BASE) MCG/ACT inhaler  Inhale into the lungs every 6 (six) hours as needed for wheezing or shortness of breath.    [provider]  gabapentin (NEURONTIN) 100 MG capsule Take 3 capsules (300 mg total) by mouth at bedtime. Patient taking differently: Take 300 mg by mouth daily as needed (pain).  05/25/14   Nita Sickle K, DO  HYDROcodone-acetaminophen (NORCO/VICODIN) 5-325 MG per tablet Take 1-2 tablets by mouth every 6 (six) hours as needed (mild pain). Patient not taking: Reported on 05/13/2017 07/07/14   Tia Alert, MD  LECITHIN PO Take 1 capsule by mouth daily.    [provider]  methocarbamol (ROBAXIN) 500 MG tablet Take 1 tablet (500 mg total) by mouth every 6 (six) hours as  needed for muscle spasms. Patient not taking: Reported on 05/13/2017 07/07/14   Tia Alert, MD    Physical Exam: Vitals:   05/14/17 0038 05/14/17 0100 05/14/17 0300 05/14/17 0500  BP: (!) 177/87 (!) 154/82 138/85 (!) 151/85  Pulse: (!) 55 (!) 55 (!) 52 (!) 55  Resp: 20 18 18 18   Temp: 98.5 F (36.9 C)     TempSrc: Oral     SpO2: 97% 97% 97% 98%  Weight: 69.7 kg (153 lb 11.2 oz)     Height: 5\' 9"  (1.753 m)         Constitutional: Moderately built and nourished. Vitals:   05/14/17 0038 05/14/17 0100 05/14/17 0300 05/14/17 0500  BP: (!) 177/87 (!) 154/82 138/85 (!) 151/85  Pulse: (!) 55 (!) 55 (!) 52 (!) 55  Resp: 20 18 18 18   Temp: 98.5 F (36.9 C)     TempSrc: Oral     SpO2: 97% 97% 97% 98%  Weight: 69.7 kg (153 lb 11.2 oz)     Height: 5\' 9"  (1.753 m)      Eyes: Anicteric no pallor. ENMT: No discharge from the ears eyes nose and mouth. Neck: No neck rigidity no mass felt. Respiratory: No rhonchi or crepitations. Cardiovascular: S1-S2 heard no murmurs appreciated. Abdomen: Soft nontender bowel sounds present. Musculoskeletal: No edema.  No joint effusion. Skin: No rash.  The skin appears warm. Neurologic: Alert awake oriented to his name.  Moves all extremities 5 x 5. Psychiatric: Appears confused.   Labs on Admission: I have personally reviewed following labs and imaging studies  CBC:  Recent Labs Lab 05/13/17 1928 05/13/17 1943 05/14/17 0156  WBC 10.4  --  8.9  NEUTROABS 4.5  --   --   HGB 15.8 16.0 15.2  HCT 45.4 47.0 43.1  MCV 91.7  --  90.7  PLT 254  --  228   Basic Metabolic Panel:  Recent Labs Lab 05/13/17 1928 05/13/17 1943 05/14/17 0156  NA 137 138  --   K 3.5 3.5  --   CL 100* 101  --   CO2 27  --   --   GLUCOSE 95 92  --   BUN 26* 27*  --   CREATININE 1.40* 1.40* 1.10  CALCIUM 9.4  --   --    GFR: Estimated Creatinine Clearance: 69.5 mL/min (by C-G formula based on SCr of 1.1 mg/dL). Liver Function Tests:  Recent Labs Lab  05/13/17 1928  AST 39  ALT 27  ALKPHOS 60  BILITOT 0.7  PROT 7.7  ALBUMIN 4.1   No results for input(s): LIPASE, AMYLASE in the last 168 hours. No results for input(s): AMMONIA in the last 168 hours. Coagulation Profile:  Recent Labs Lab 05/13/17 1928  INR 0.94   Cardiac Enzymes: No results for input(s): CKTOTAL, CKMB, CKMBINDEX, TROPONINI in the last 168 hours. BNP (last 3 results) No results for input(s): PROBNP in the last 8760 hours. HbA1C:  Recent Labs  05/14/17 0156  HGBA1C 5.2   CBG:  Recent Labs Lab 05/13/17 1919 05/13/17 2107  GLUCAP 84 86   Lipid Profile:  Recent Labs  05/14/17 0156  CHOL 198  HDL 72  LDLCALC 111*  TRIG 76  CHOLHDL 2.8   Thyroid Function Tests: No results for input(s): TSH, T4TOTAL, FREET4, T3FREE, THYROIDAB in the last 72 hours. Anemia Panel: No results for input(s): VITAMINB12, FOLATE, FERRITIN, TIBC, IRON, RETICCTPCT in the last 72 hours. Urine analysis: No results found for: COLORURINE, APPEARANCEUR, LABSPEC, PHURINE, GLUCOSEU, HGBUR, BILIRUBINUR, KETONESUR, PROTEINUR, UROBILINOGEN, NITRITE, LEUKOCYTESUR Sepsis Labs: @LABRCNTIP (procalcitonin:4,lacticidven:4) )No results found for this or any previous visit (from the past 240 hour(s)).   Radiological Exams on Admission: Mr Shirlee Latch ZO Contrast  Result Date: 05/13/2017 CLINICAL DATA:  Initial evaluation for acute memory loss. EXAM: MRI HEAD WITHOUT CONTRAST MRA HEAD WITHOUT CONTRAST TECHNIQUE: Multiplanar, multiecho pulse sequences of the brain and surrounding structures were obtained without intravenous contrast. Angiographic images of the head were obtained using MRA technique without contrast. COMPARISON:  Prior CT from earlier same day. FINDINGS: MRI HEAD FINDINGS Brain: Study degraded by motion artifact. Cerebral volume within normal limits for age. No significant cerebral white matter disease. No abnormal foci of restricted diffusion to suggest acute or subacute  ischemia. Gray-white matter differentiation well maintained. No encephalomalacia to suggest chronic infarction. No evidence for acute or chronic intracranial hemorrhage. No mass lesion, midline shift or mass effect. No hydrocephalus. No extra-axial fluid collection. Major dural sinuses grossly patent. Pituitary gland suprasellar region normal. Midline structures intact and normal. Vascular: Major intravascular flow voids maintained. Skull and upper cervical spine: Craniocervical junction normal. Bone marrow signal intensity within normal limits. No scalp soft tissue abnormality. Sinuses/Orbits: Globes and orbital soft tissues within normal limits. Other: Moderate mucosal thickening within the ethmoidal air cells, left sphenoid sinus, and maxillary sinuses. No air-fluid level to suggest acute sinusitis. Trace bilateral mastoid effusions, right slightly larger than left, of doubtful significance. Inner ear structures normal. MRA HEAD FINDINGS ANTERIOR CIRCULATION: Study degraded by motion artifact. Distal cervical segments of the internal carotid arteries are patent with antegrade flow. Petrous, cavernous, and supraclinoid segments patent without hemodynamically significant stenosis. A1 segments patent bilaterally. Anterior communicating artery not well evaluated due to motion. Anterior cerebral arteries grossly patent to their distal aspects without obvious stenosis. Patent M1 segments without definite stenosis, although evaluation limited by motion. Distal MCA branches grossly symmetric and well perfused. POSTERIOR CIRCULATION: Vertebral arteries patent to the vertebrobasilar junction without stenosis. Left vertebral artery dominant. Posterior inferior cerebral arteries patent proximally. Basilar artery widely patent to its distal aspect. Superior cerebral arteries patent bilaterally. Both of the posterior cerebral arteries appear to be supplied via the basilar and are grossly perfused to their distal aspects,  although evaluation limited by motion. IMPRESSION: MRI HEAD IMPRESSION: 1. Normal brain MRI for age. No acute intracranial abnormality identified. 2. Moderate paranasal sinus disease as above, likely allergic/inflammatory in nature. MRA HEAD IMPRESSION: 1. Motion degraded exam. 2. Grossly normal intracranial MRA. No large vessel occlusion. No obvious high-grade or correctable stenosis. Electronically Signed   By: Rise Mu M.D.   On: 05/13/2017 22:13   Mr Brain Wo Contrast  Result Date: 05/13/2017 CLINICAL DATA:  Initial evaluation for acute memory loss. EXAM:  MRI HEAD WITHOUT CONTRAST MRA HEAD WITHOUT CONTRAST TECHNIQUE: Multiplanar, multiecho pulse sequences of the brain and surrounding structures were obtained without intravenous contrast. Angiographic images of the head were obtained using MRA technique without contrast. COMPARISON:  Prior CT from earlier same day. FINDINGS: MRI HEAD FINDINGS Brain: Study degraded by motion artifact. Cerebral volume within normal limits for age. No significant cerebral white matter disease. No abnormal foci of restricted diffusion to suggest acute or subacute ischemia. Gray-white matter differentiation well maintained. No encephalomalacia to suggest chronic infarction. No evidence for acute or chronic intracranial hemorrhage. No mass lesion, midline shift or mass effect. No hydrocephalus. No extra-axial fluid collection. Major dural sinuses grossly patent. Pituitary gland suprasellar region normal. Midline structures intact and normal. Vascular: Major intravascular flow voids maintained. Skull and upper cervical spine: Craniocervical junction normal. Bone marrow signal intensity within normal limits. No scalp soft tissue abnormality. Sinuses/Orbits: Globes and orbital soft tissues within normal limits. Other: Moderate mucosal thickening within the ethmoidal air cells, left sphenoid sinus, and maxillary sinuses. No air-fluid level to suggest acute sinusitis. Trace  bilateral mastoid effusions, right slightly larger than left, of doubtful significance. Inner ear structures normal. MRA HEAD FINDINGS ANTERIOR CIRCULATION: Study degraded by motion artifact. Distal cervical segments of the internal carotid arteries are patent with antegrade flow. Petrous, cavernous, and supraclinoid segments patent without hemodynamically significant stenosis. A1 segments patent bilaterally. Anterior communicating artery not well evaluated due to motion. Anterior cerebral arteries grossly patent to their distal aspects without obvious stenosis. Patent M1 segments without definite stenosis, although evaluation limited by motion. Distal MCA branches grossly symmetric and well perfused. POSTERIOR CIRCULATION: Vertebral arteries patent to the vertebrobasilar junction without stenosis. Left vertebral artery dominant. Posterior inferior cerebral arteries patent proximally. Basilar artery widely patent to its distal aspect. Superior cerebral arteries patent bilaterally. Both of the posterior cerebral arteries appear to be supplied via the basilar and are grossly perfused to their distal aspects, although evaluation limited by motion. IMPRESSION: MRI HEAD IMPRESSION: 1. Normal brain MRI for age. No acute intracranial abnormality identified. 2. Moderate paranasal sinus disease as above, likely allergic/inflammatory in nature. MRA HEAD IMPRESSION: 1. Motion degraded exam. 2. Grossly normal intracranial MRA. No large vessel occlusion. No obvious high-grade or correctable stenosis. Electronically Signed   By: Rise Mu M.D.   On: 05/13/2017 22:13   Ct Head Code Stroke Wo Contrast  Result Date: 05/13/2017 CLINICAL DATA:  Code stroke. Initial evaluation for acute altered mental status. EXAM: CT HEAD WITHOUT CONTRAST TECHNIQUE: Contiguous axial images were obtained from the base of the skull through the vertex without intravenous contrast. COMPARISON:  None available. FINDINGS: Brain: Cerebral  volume within normal limits for patient age. No evidence for acute intracranial hemorrhage. No findings to suggest acute large vessel territory infarct. No mass lesion, midline shift, or mass effect. Ventricles are normal in size without evidence for hydrocephalus. No extra-axial fluid collection identified. Vascular: No hyperdense vessel identified. Skull: Scalp soft tissues demonstrate no acute abnormality.Calvarium intact. Sinuses/Orbits: Globes and orbital soft tissues are within normal limits. Mucosal thickening seen within the ethmoidal air cells bilaterally as well as the left sphenoid and visualized left maxillary sinus. No air-fluid level to suggest acute sinusitis. Trace bilateral mastoid effusions ASPECTS (Alberta Stroke Program Early CT Score) - Ganglionic level infarction (caudate, lentiform nuclei, internal capsule, insula, M1-M3 cortex): 7 - Supraganglionic infarction (M4-M6 cortex): 3 Total score (0-10 with 10 being normal): 10 IMPRESSION: 1. Normal head CT.  No acute intracranial abnormality identified. 2.  ASPECTS is 10. Critical Value/emergent results were called by telephone at the time of interpretation on 05/13/2017 at 8:26 pm to Dr. Bary Castilla , who verbally acknowledged these results. Electronically Signed   By: Rise Mu M.D.   On: 05/13/2017 20:27    EKG: Independently reviewed.  Normal sinus rhythm.  Assessment/Plan Principal Problem:   TIA (transient ischemic attack) Active Problems:   Hypertension, essential   S/P cervical spinal fusion    1. TIA versus transient global amnesia -appreciate neurology consult.  At this time we will closely monitor in telemetry and place patient on neurochecks.  Check ammonia levels 2D echo carotid Doppler. 2. Hypertension -hold hydrochlorothiazide continue Cozaar for now since patient is getting gentle hydration. 3. Renal failure probably chronic stage III -if any worsening of creatinine may have to hold ARB. 4. History of  asthma -not actively wheezing.   DVT prophylaxis: Lovenox. Code Status: Full code. Family Communication: Discussed with patient's wife. Disposition Plan: Home. Consults called: Neurology. Admission status: Observation.   Eduard Clos MD [facility] Pager [phone].  If 7PM-7AM, please contact night-coverage [email or web address] Password [email or web address]  05/14/2017, 7:11 AM

## 2017-05-14 NOTE — Progress Notes (Signed)
OT Cancellation Note  Patient Details Name: Johnsie CancelDouglas C Mizell MRN: [id number] DOB: 1955-11-26   Cancelled Treatment:    Reason Eval/Treat Not Completed: Patient at procedure or test/ unavailable  [facility] CenterWARD,HILLARY  Marijke Guadiana, OT/L  [phone] 05/14/2017 05/14/2017, 9:10 AM

## 2017-05-14 NOTE — Progress Notes (Signed)
PROGRESS NOTE    Daniel Rush  AVW:[id number] DOB: 10/05/1955 DOA: 05/13/2017 PCP: System, Provider Not In    Brief Narrative: Daniel Rush is a 61 y.o. male with history of hypertension, asthma was brought to the ER after patient's wife found that patient was having some memory loss.  Last evening at around 5 PM patient had gone for walking with his wife and his dog.  Patient's wife continue to walk but patient went back to his house with his dog.  When patient's wife arrived back home patient was found to be confused and was not able to recall recent incidents.  Patient did not have any loss of consciousness headache visual symptoms and difficulty speaking swallowing or any weakness of the extremities.  ED Course: In the ER MRI of the brain and MRA of the brain were unremarkable.  Neurology on-call was consulted and patient is being admitted for further observation  Assessment & Plan:   Principal Problem:   TIA (transient ischemic attack) Active Problems:   Hypertension, essential   S/P cervical spinal fusion  Transient global Amnesia vs hyperammonemia;  EEG; normal.  Ammonia at 60-- repeated at 40--started lactulose.  MRI negative for stroke.  LDL 111. HB A1c 5.2. MRI negative  Aspirin.   \HTN; continue with Cozaar,   AKI on CKD stage II; cr improved.   History of asthma.    DVT prophylaxis: Lovenox Code Status: Full code.  Family Communication: care discussed with patient.  Disposition Plan: remain in hospital overnight.    Consultants:   Neurology   Procedures: ECHO; Normal LV systolic function; probable mild diastolic dysfunction.   Antimicrobials:   none   Subjective: Patient is alert, oriented. He still doesn't remember events from yesterday. He doesn't know why he ended in the hospital.  He has not been confuse per wife.   Objective: Vitals:   05/14/17 0500 05/14/17 0700 05/14/17 0849 05/14/17 1044  BP: (!) 151/85 (!) 154/82 (!) 150/80 (!)  164/87  Pulse: (!) 55 (!) 57 (!) 50 (!) 54  Resp: 18 18 18    Temp:   98.5 F (36.9 C)   TempSrc:   Oral   SpO2: 98% 100% 99%   Weight:      Height:        Intake/Output Summary (Last 24 hours) at 05/14/17 1109 Last data filed at 05/14/17 0300  Gross per 24 hour  Intake            94.17 ml  Output                0 ml  Net            94.17 ml   Filed Weights   05/13/17 1916 05/14/17 0038  Weight: 68 kg (150 lb) 69.7 kg (153 lb 11.2 oz)    Examination:  General exam: Appears calm and comfortable  Respiratory system: Clear to auscultation. Respiratory effort normal. Cardiovascular system: S1 & S2 heard, RRR. No JVD, murmurs, rubs, gallops or clicks. No pedal edema. Gastrointestinal system: Abdomen is nondistended, soft and nontender. No organomegaly or masses felt. Normal bowel sounds heard. Central nervous system: Alert and oriented. No focal neurological deficits. Extremities: Symmetric 5 x 5 power. Skin: No rashes, lesions or ulcers   Data Reviewed: I have personally reviewed following labs and imaging studies  CBC:  Recent Labs Lab 05/13/17 1928 05/13/17 1943 05/14/17 0156  WBC 10.4  --  8.9  NEUTROABS 4.5  --   --  HGB 15.8 16.0 15.2  HCT 45.4 47.0 43.1  MCV 91.7  --  90.7  PLT 254  --  228   Basic Metabolic Panel:  Recent Labs Lab 05/13/17 1928 05/13/17 1943 05/14/17 0156  NA 137 138  --   K 3.5 3.5  --   CL 100* 101  --   CO2 27  --   --   GLUCOSE 95 92  --   BUN 26* 27*  --   CREATININE 1.40* 1.40* 1.10  CALCIUM 9.4  --   --    GFR: Estimated Creatinine Clearance: 69.5 mL/min (by C-G formula based on SCr of 1.1 mg/dL). Liver Function Tests:  Recent Labs Lab 05/13/17 1928  AST 39  ALT 27  ALKPHOS 60  BILITOT 0.7  PROT 7.7  ALBUMIN 4.1   No results for input(s): LIPASE, AMYLASE in the last 168 hours.  Recent Labs Lab 05/14/17 0846  AMMONIA 62*   Coagulation Profile:  Recent Labs Lab 05/13/17 1928  INR 0.94   Cardiac  Enzymes: No results for input(s): CKTOTAL, CKMB, CKMBINDEX, TROPONINI in the last 168 hours. BNP (last 3 results) No results for input(s): PROBNP in the last 8760 hours. HbA1C:  Recent Labs  05/14/17 0156  HGBA1C 5.2   CBG:  Recent Labs Lab 05/13/17 1919 05/13/17 2107  GLUCAP 84 86   Lipid Profile:  Recent Labs  05/14/17 0156  CHOL 198  HDL 72  LDLCALC 111*  TRIG 76  CHOLHDL 2.8   Thyroid Function Tests: No results for input(s): TSH, T4TOTAL, FREET4, T3FREE, THYROIDAB in the last 72 hours. Anemia Panel: No results for input(s): VITAMINB12, FOLATE, FERRITIN, TIBC, IRON, RETICCTPCT in the last 72 hours. Sepsis Labs: No results for input(s): PROCALCITON, LATICACIDVEN in the last 168 hours.  No results found for this or any previous visit (from the past 240 hour(s)).       Radiology Studies: Ct Angio Head W Or Wo Contrast  Result Date: 05/14/2017 CLINICAL DATA:  Acute onset confusion and memory loss. EXAM: CT ANGIOGRAPHY HEAD AND NECK TECHNIQUE: Multidetector CT imaging of the head and neck was performed using the standard protocol during bolus administration of intravenous contrast. Multiplanar CT image reconstructions and MIPs were obtained to evaluate the vascular anatomy. Carotid stenosis measurements (when applicable) are obtained utilizing NASCET criteria, using the distal internal carotid diameter as the denominator. CONTRAST:  50 mL Isovue 370 COMPARISON:  Head MRI/ MRA 05/13/2017 FINDINGS: CTA NECK FINDINGS Aortic arch: Standard 3 vessel aortic arch. Widely patent arch vessel origins. Right carotid system: Patent without evidence of dissection or significant stenosis. Minimal calcified plaque at the carotid bifurcation. Left carotid system: Patent without evidence of dissection or significant stenosis. Minimal, predominantly calcified plaque at the carotid bifurcation. Vertebral arteries: Patent with the left being dominant. Mild left vertebral artery origin  stenosis due to mixed calcified and soft plaque. Skeleton: Solid C3-C6 ACDF. Moderate disc space narrowing and degenerative endplate changes at C6-7. Other neck: No mass or lymph node enlargement. Upper chest: Clear lung apices. Review of the MIP images confirms the above findings CTA HEAD FINDINGS Anterior circulation: The internal carotid arteries are widely patent from skullbase to carotid termini. ACAs and MCAs are patent without evidence of proximal branch occlusion or significant stenosis. No aneurysm. Posterior circulation: The intracranial vertebral arteries are patent to the basilar. Patent bilateral PICA, left AICA, and bilateral SCA origins are identified. The basilar artery is widely patent. There are medium sized posterior communicating arteries bilaterally.  PCAs are patent without evidence of significant stenosis. No aneurysm. Venous sinuses: Patent. Anatomic variants: None. Delayed phase: No abnormal enhancement. Review of the MIP images confirms the above findings IMPRESSION: 1. Negative head CTA. 2. Minimal cervical carotid artery atherosclerosis without stenosis. 3. Mild stenosis of the dominant left vertebral artery origin. Electronically Signed   By: Sebastian Ache M.D.   On: 05/14/2017 08:42   Ct Angio Neck W Or Wo Contrast  Result Date: 05/14/2017 CLINICAL DATA:  Acute onset confusion and memory loss. EXAM: CT ANGIOGRAPHY HEAD AND NECK TECHNIQUE: Multidetector CT imaging of the head and neck was performed using the standard protocol during bolus administration of intravenous contrast. Multiplanar CT image reconstructions and MIPs were obtained to evaluate the vascular anatomy. Carotid stenosis measurements (when applicable) are obtained utilizing NASCET criteria, using the distal internal carotid diameter as the denominator. CONTRAST:  50 mL Isovue 370 COMPARISON:  Head MRI/ MRA 05/13/2017 FINDINGS: CTA NECK FINDINGS Aortic arch: Standard 3 vessel aortic arch. Widely patent arch vessel  origins. Right carotid system: Patent without evidence of dissection or significant stenosis. Minimal calcified plaque at the carotid bifurcation. Left carotid system: Patent without evidence of dissection or significant stenosis. Minimal, predominantly calcified plaque at the carotid bifurcation. Vertebral arteries: Patent with the left being dominant. Mild left vertebral artery origin stenosis due to mixed calcified and soft plaque. Skeleton: Solid C3-C6 ACDF. Moderate disc space narrowing and degenerative endplate changes at C6-7. Other neck: No mass or lymph node enlargement. Upper chest: Clear lung apices. Review of the MIP images confirms the above findings CTA HEAD FINDINGS Anterior circulation: The internal carotid arteries are widely patent from skullbase to carotid termini. ACAs and MCAs are patent without evidence of proximal branch occlusion or significant stenosis. No aneurysm. Posterior circulation: The intracranial vertebral arteries are patent to the basilar. Patent bilateral PICA, left AICA, and bilateral SCA origins are identified. The basilar artery is widely patent. There are medium sized posterior communicating arteries bilaterally. PCAs are patent without evidence of significant stenosis. No aneurysm. Venous sinuses: Patent. Anatomic variants: None. Delayed phase: No abnormal enhancement. Review of the MIP images confirms the above findings IMPRESSION: 1. Negative head CTA. 2. Minimal cervical carotid artery atherosclerosis without stenosis. 3. Mild stenosis of the dominant left vertebral artery origin. Electronically Signed   By: Sebastian Ache M.D.   On: 05/14/2017 08:42   Mr Maxine Glenn Head Wo Contrast  Result Date: 05/13/2017 CLINICAL DATA:  Initial evaluation for acute memory loss. EXAM: MRI HEAD WITHOUT CONTRAST MRA HEAD WITHOUT CONTRAST TECHNIQUE: Multiplanar, multiecho pulse sequences of the brain and surrounding structures were obtained without intravenous contrast. Angiographic images of  the head were obtained using MRA technique without contrast. COMPARISON:  Prior CT from earlier same day. FINDINGS: MRI HEAD FINDINGS Brain: Study degraded by motion artifact. Cerebral volume within normal limits for age. No significant cerebral white matter disease. No abnormal foci of restricted diffusion to suggest acute or subacute ischemia. Gray-white matter differentiation well maintained. No encephalomalacia to suggest chronic infarction. No evidence for acute or chronic intracranial hemorrhage. No mass lesion, midline shift or mass effect. No hydrocephalus. No extra-axial fluid collection. Major dural sinuses grossly patent. Pituitary gland suprasellar region normal. Midline structures intact and normal. Vascular: Major intravascular flow voids maintained. Skull and upper cervical spine: Craniocervical junction normal. Bone marrow signal intensity within normal limits. No scalp soft tissue abnormality. Sinuses/Orbits: Globes and orbital soft tissues within normal limits. Other: Moderate mucosal thickening within the ethmoidal air cells,  left sphenoid sinus, and maxillary sinuses. No air-fluid level to suggest acute sinusitis. Trace bilateral mastoid effusions, right slightly larger than left, of doubtful significance. Inner ear structures normal. MRA HEAD FINDINGS ANTERIOR CIRCULATION: Study degraded by motion artifact. Distal cervical segments of the internal carotid arteries are patent with antegrade flow. Petrous, cavernous, and supraclinoid segments patent without hemodynamically significant stenosis. A1 segments patent bilaterally. Anterior communicating artery not well evaluated due to motion. Anterior cerebral arteries grossly patent to their distal aspects without obvious stenosis. Patent M1 segments without definite stenosis, although evaluation limited by motion. Distal MCA branches grossly symmetric and well perfused. POSTERIOR CIRCULATION: Vertebral arteries patent to the vertebrobasilar junction  without stenosis. Left vertebral artery dominant. Posterior inferior cerebral arteries patent proximally. Basilar artery widely patent to its distal aspect. Superior cerebral arteries patent bilaterally. Both of the posterior cerebral arteries appear to be supplied via the basilar and are grossly perfused to their distal aspects, although evaluation limited by motion. IMPRESSION: MRI HEAD IMPRESSION: 1. Normal brain MRI for age. No acute intracranial abnormality identified. 2. Moderate paranasal sinus disease as above, likely allergic/inflammatory in nature. MRA HEAD IMPRESSION: 1. Motion degraded exam. 2. Grossly normal intracranial MRA. No large vessel occlusion. No obvious high-grade or correctable stenosis. Electronically Signed   By: Rise Mu M.D.   On: 05/13/2017 22:13   Mr Brain Wo Contrast  Result Date: 05/13/2017 CLINICAL DATA:  Initial evaluation for acute memory loss. EXAM: MRI HEAD WITHOUT CONTRAST MRA HEAD WITHOUT CONTRAST TECHNIQUE: Multiplanar, multiecho pulse sequences of the brain and surrounding structures were obtained without intravenous contrast. Angiographic images of the head were obtained using MRA technique without contrast. COMPARISON:  Prior CT from earlier same day. FINDINGS: MRI HEAD FINDINGS Brain: Study degraded by motion artifact. Cerebral volume within normal limits for age. No significant cerebral white matter disease. No abnormal foci of restricted diffusion to suggest acute or subacute ischemia. Gray-white matter differentiation well maintained. No encephalomalacia to suggest chronic infarction. No evidence for acute or chronic intracranial hemorrhage. No mass lesion, midline shift or mass effect. No hydrocephalus. No extra-axial fluid collection. Major dural sinuses grossly patent. Pituitary gland suprasellar region normal. Midline structures intact and normal. Vascular: Major intravascular flow voids maintained. Skull and upper cervical spine: Craniocervical  junction normal. Bone marrow signal intensity within normal limits. No scalp soft tissue abnormality. Sinuses/Orbits: Globes and orbital soft tissues within normal limits. Other: Moderate mucosal thickening within the ethmoidal air cells, left sphenoid sinus, and maxillary sinuses. No air-fluid level to suggest acute sinusitis. Trace bilateral mastoid effusions, right slightly larger than left, of doubtful significance. Inner ear structures normal. MRA HEAD FINDINGS ANTERIOR CIRCULATION: Study degraded by motion artifact. Distal cervical segments of the internal carotid arteries are patent with antegrade flow. Petrous, cavernous, and supraclinoid segments patent without hemodynamically significant stenosis. A1 segments patent bilaterally. Anterior communicating artery not well evaluated due to motion. Anterior cerebral arteries grossly patent to their distal aspects without obvious stenosis. Patent M1 segments without definite stenosis, although evaluation limited by motion. Distal MCA branches grossly symmetric and well perfused. POSTERIOR CIRCULATION: Vertebral arteries patent to the vertebrobasilar junction without stenosis. Left vertebral artery dominant. Posterior inferior cerebral arteries patent proximally. Basilar artery widely patent to its distal aspect. Superior cerebral arteries patent bilaterally. Both of the posterior cerebral arteries appear to be supplied via the basilar and are grossly perfused to their distal aspects, although evaluation limited by motion. IMPRESSION: MRI HEAD IMPRESSION: 1. Normal brain MRI for age. No acute intracranial  abnormality identified. 2. Moderate paranasal sinus disease as above, likely allergic/inflammatory in nature. MRA HEAD IMPRESSION: 1. Motion degraded exam. 2. Grossly normal intracranial MRA. No large vessel occlusion. No obvious high-grade or correctable stenosis. Electronically Signed   By: Rise MuBenjamin  McClintock M.D.   On: 05/13/2017 22:13   Ct Head Code Stroke  Wo Contrast  Result Date: 05/13/2017 CLINICAL DATA:  Code stroke. Initial evaluation for acute altered mental status. EXAM: CT HEAD WITHOUT CONTRAST TECHNIQUE: Contiguous axial images were obtained from the base of the skull through the vertex without intravenous contrast. COMPARISON:  None available. FINDINGS: Brain: Cerebral volume within normal limits for patient age. No evidence for acute intracranial hemorrhage. No findings to suggest acute large vessel territory infarct. No mass lesion, midline shift, or mass effect. Ventricles are normal in size without evidence for hydrocephalus. No extra-axial fluid collection identified. Vascular: No hyperdense vessel identified. Skull: Scalp soft tissues demonstrate no acute abnormality.Calvarium intact. Sinuses/Orbits: Globes and orbital soft tissues are within normal limits. Mucosal thickening seen within the ethmoidal air cells bilaterally as well as the left sphenoid and visualized left maxillary sinus. No air-fluid level to suggest acute sinusitis. Trace bilateral mastoid effusions ASPECTS ([place] Stroke Program Early CT Score) - Ganglionic level infarction (caudate, lentiform nuclei, internal capsule, insula, M1-M3 cortex): 7 - Supraganglionic infarction (M4-M6 cortex): 3 Total score (0-10 with 10 being normal): 10 IMPRESSION: 1. Normal head CT.  No acute intracranial abnormality identified. 2. ASPECTS is 10. Critical Value/emergent results were called by telephone at the time of interpretation on 05/13/2017 at 8:26 pm to Dr. Bary CastillaOURTENEY MACKUEN , who verbally acknowledged these results. Electronically Signed   By: Rise MuBenjamin  McClintock M.D.   On: 05/13/2017 20:27        Scheduled Meds: . aspirin  300 mg Rectal Daily   Or  . aspirin  325 mg Oral Daily  . enoxaparin (LOVENOX) injection  40 mg Subcutaneous Q24H  . [START ON 05/15/2017] Influenza vac split quadrivalent PF  0.5 mL Intramuscular Tomorrow-1000  . losartan  50 mg Oral Daily  . omega-3 acid  ethyl esters  2 g Oral Daily  . thiamine injection  100 mg Intravenous Daily  . vitamin C  500 mg Oral Daily   Continuous Infusions: . sodium chloride 50 mL/hr at 05/14/17 0107     LOS: 0 days    Time spent: 35 minutes.     Alba Coryegalado, Cindie Rajagopalan A, MD [facility] Pager [phone]  If 7PM-7AM, please contact night-coverage [email or web address] Password [email or web address] 05/14/2017, 11:09 AM

## 2017-05-14 NOTE — Progress Notes (Signed)
  Echocardiogram 2D Echocardiogram has been performed.  Tye SavoyCasey N Sia Gabrielsen 05/14/2017, 12:26 PM

## 2017-05-14 NOTE — Procedures (Signed)
ELECTROENCEPHALOGRAM REPORT  Date of Study: 05/14/2017  Patient's Name: Daniel CancelDouglas C Batres MRN: [id number] Date of Birth: [birth year]  Referring Provider: Midge MiniumArshad Kakrakandy, MD  Clinical History: 61 y.o. male with history of hypertension, asthma was brought to the ER after patient's wife found that patient was having some memory loss.  Medications: acetaminophen (TYLENOL) tablet 650 mg  albuterol (PROVENTIL) (2.5 MG/3ML)  aspirin tablet 325 mg  enoxaparin (LOVENOX) injection 40 mg  losartan (COZAAR) tablet 50 mg  omega-3 acid ethyl esters (LOVAZA) capsule 2 g  thiamine (B-1) injection 100 mg  vitamin C (ASCORBIC ACID) tablet 500 mg   Technical Summary: A multichannel digital EEG recording measured by the international 10-20 system with electrodes applied with paste and impedances below 5000 ohms performed in our laboratory with EKG monitoring in an awake and asleep patient.  Hyperventilation and photic stimulation were performed.  The digital EEG was referentially recorded, reformatted, and digitally filtered in a variety of bipolar and referential montages for optimal display.    Description: The patient is awake and asleep during the recording.  During maximal wakefulness, there is a symmetric, medium voltage 10 Hz posterior dominant rhythm that attenuates with eye opening.  The record is symmetric.  During drowsiness and sleep, there is an increase in theta slowing of the background.  Vertex waves and symmetric sleep spindles were seen.  Hyperventilation and photic stimulation did not elicit any abnormalities.  There were no epileptiform discharges or electrographic seizures seen.    EKG lead was unremarkable.  Impression: This awake and asleep EEG is normal.    Clinical Correlation: A normal EEG does not exclude a clinical diagnosis of epilepsy.  If further clinical questions remain, prolonged EEG may be helpful.  Clinical correlation is advised.   Shon MilletAdam Jadene Stemmer, DO

## 2017-05-14 NOTE — Progress Notes (Signed)
Routine EEG complete, results pending.

## 2017-05-14 NOTE — Progress Notes (Addendum)
STROKE TEAM PROGRESS NOTE   SUBJECTIVE (INTERVAL HISTORY) His wife is at the bedside.  Overall he feels his condition is gradually improving. Patient states he feels he is at his mentation baseline. He states he is very "frustrated" being in the hospital and wants to go home. He voices no new complaints and no new events reported overnight.   OBJECTIVE Temp:  [97.1 F (36.2 C)-98.5 F (36.9 C)] 97.1 F (36.2 C) (10/25 1350) Pulse Rate:  [50-68] 54 (10/25 1350) Cardiac Rhythm: Sinus bradycardia (10/25 0849) Resp:  [13-20] 18 (10/25 1350) BP: (132-177)/(76-105) 132/76 (10/25 1350) SpO2:  [95 %-100 %] 97 % (10/25 1350) Weight:  [68 kg (150 lb)-69.7 kg (153 lb 11.2 oz)] 69.7 kg (153 lb 11.2 oz) (10/25 0038)   Recent Labs Lab 05/13/17 1919 05/13/17 2107  GLUCAP 84 86    Recent Labs Lab 05/13/17 1928 05/13/17 1943 05/14/17 0156 05/14/17 1233  NA 137 138  --  138  K 3.5 3.5  --  4.1  CL 100* 101  --  105  CO2 27  --   --  26  GLUCOSE 95 92  --  100*  BUN 26* 27*  --  15  CREATININE 1.40* 1.40* 1.10 1.03  CALCIUM 9.4  --   --  9.2    Recent Labs Lab 05/13/17 1928  AST 39  ALT 27  ALKPHOS 60  BILITOT 0.7  PROT 7.7  ALBUMIN 4.1    Recent Labs Lab 05/13/17 1928 05/13/17 1943 05/14/17 0156  WBC 10.4  --  8.9  NEUTROABS 4.5  --   --   HGB 15.8 16.0 15.2  HCT 45.4 47.0 43.1  MCV 91.7  --  90.7  PLT 254  --  228   No results for input(s): CKTOTAL, CKMB, CKMBINDEX, TROPONINI in the last 168 hours.  Recent Labs  05/13/17 1928  LABPROT 12.5  INR 0.94   No results for input(s): COLORURINE, LABSPEC, PHURINE, GLUCOSEU, HGBUR, BILIRUBINUR, KETONESUR, PROTEINUR, UROBILINOGEN, NITRITE, LEUKOCYTESUR in the last 72 hours.  Invalid input(s): APPERANCEUR     Component Value Date/Time   CHOL 198 05/14/2017 0156   TRIG 76 05/14/2017 0156   HDL 72 05/14/2017 0156   CHOLHDL 2.8 05/14/2017 0156   VLDL 15 05/14/2017 0156   LDLCALC 111 (H) 05/14/2017 0156   Lab  Results  Component Value Date   HGBA1C 5.2 05/14/2017   No results found for: LABOPIA, COCAINSCRNUR, LABBENZ, AMPHETMU, THCU, LABBARB  No results for input(s): ETH in the last 168 hours.  I have personally reviewed the radiological images below and agree with the radiology interpretations.  Ct Angio Head W Or Wo Contrast Result Date: 05/14/2017 IMPRESSION: 1. Negative head CTA. 2. Minimal cervical carotid artery atherosclerosis without stenosis. 3. Mild stenosis of the dominant left vertebral artery origin.   Ct Angio Neck W Or Wo Contrast Result Date: 05/14/2017 IMPRESSION: 1. Negative head CTA. 2. Minimal cervical carotid artery atherosclerosis without stenosis. 3. Mild stenosis of the dominant left vertebral artery origin.  Mr Maxine Glenn Head Wo Contrast Result Date: 05/13/2017 IMPRESSION: MRI HEAD IMPRESSION: 1. Normal brain MRI for age. No acute intracranial abnormality identified. 2. Moderate paranasal sinus disease as above, likely allergic/inflammatory in nature. MRA HEAD IMPRESSION: 1. Motion degraded exam. 2. Grossly normal intracranial MRA. No large vessel occlusion. No obvious high-grade or correctable stenosis. Electronically Signed   By: Rise Mu M.D.   On: 05/13/2017 22:13   Mr Brain Wo Contrast Result Date: 05/13/2017 IMPRESSION:  MRI HEAD IMPRESSION: 1. Normal brain MRI for age. No acute intracranial abnormality identified. 2. Moderate paranasal sinus disease as above, likely allergic/inflammatory in nature. MRA HEAD IMPRESSION: 1. Motion degraded exam. 2. Grossly normal intracranial MRA. No large vessel occlusion. No obvious high-grade or correctable stenosis.   Ct Head Code Stroke Wo Contrast Result Date: 05/13/2017 IMPRESSION: 1. Normal head CT.  No acute intracranial abnormality identified. 2. ASPECTS is 10.   EEG  This awake and asleep EEG is normal. A normal EEG does not exclude a clinical diagnosis of epilepsy  2D Echocardiogram  LVEF 55-60%, Normal LV  systolic function; probable mild diastolic dysfunction   PHYSICAL EXAM  Temp:  [97.1 F (36.2 C)-98.5 F (36.9 C)] 97.1 F (36.2 C) (10/25 1350) Pulse Rate:  [50-68] 54 (10/25 1350) Resp:  [13-20] 18 (10/25 1350) BP: (132-177)/(76-105) 132/76 (10/25 1350) SpO2:  [95 %-100 %] 97 % (10/25 1350) Weight:  [68 kg (150 lb)-69.7 kg (153 lb 11.2 oz)] 69.7 kg (153 lb 11.2 oz) (10/25 0038)  General - Well nourished, well developed, in no apparent distress Respiratory - Lungs clear bilaterally. No wheezing Cardiovascular - Regular rate and rhythm with no murmur  Mental Status -  Level of arousal and orientation to time, place, and person were intact Language including expression, naming, repetition, comprehension was assessed and found intact Attention span and concentration were normal Recent and remote memory were intact. Fund of Knowledge was assessed and was intact. Odd affect but answer all of examiners questions appropriately  Cranial Nerves II - XII - II - Visual field intact OU III, IV, VI - Extraocular movements intact V - Facial sensation intact bilaterally VII - Facial movement intact bilaterally VIII - Hearing & vestibular intact bilaterally X - Palate elevates symmetrically XI - Chin turning & shoulder shrug intact bilaterally XII - Tongue protrusion intact  Motor Strength - The patient's strength was normal in all extremities and pronator drift was absent.  Bulk was normal and fasciculations were absent.   Motor Tone - Muscle tone was assessed at the neck and appendages and was normal  Reflexes - The patient's reflexes were symmetrical in all extremities and he had no pathological reflexes  Sensory - Light touch was symmetrical    Coordination - The patient had normal movements in the hands and feet with no ataxia or dysmetria.  Tremor was absent  Gait and Station - deferred.   ASSESSMENT/PLAN [name] is a 61 y.o. male with history of HTN admitted for  sudden onset of confusion. Patient is at mentation baseline on exam today. Patients Ammonia level is elevated and Lactulose has been given. While interviewing the patient he explained that he trains daily for [employer] and takes in large amounts of protein in his diet including daily Protein powder with 52 grams of protein in addition to his daily food intake.   Acute memory loss - TGA vs. Hyperammonemia secondary to excessive protein intake?  MRI/ MRA Head - No acute intracranial abnormality identified. No large vessel occlusion/stenosis  CTA Head/Neck  -Negative  2D Echo  LVEF 55-60%  EEG normal  LDL 111  HgbA1c 5.2  Lovenox for VTE prophylaxis  Diet Heart Room service appropriate? Yes; Fluid consistency: Thin   No antithrombotic prior to admission, now on aspirin 81 mg daily and to be continued at discharge for risk factor management  Patient counseled to be compliant with his antithrombotic medications  Ongoing aggressive stroke risk factor management  Therapy recommendations:  No therapy needed at this time  Disposition:  Likely HOME in AM if improving. Patient will need close follow up with PCP. Follow up Neurology - Dr Nita Sickleonika Patel in 4 weeks.  Hyperammonemia  Could be related to excessive protein intake  Level trending down slowly and Lactulose given  Ammonia 62->44, am lab pending  Educate patient on dangers of excessive protein intake   Hypertension Stable Permissive hypertension (OK if <220/120) for 24-48 hours post stroke and then gradually normalized within 5-7 days.  Long term BP goal normotensive, Restart home B/P meds prior to discharge  Hyperlipidemia  Home meds:  None  LDL 111, goal < 90  Lipitor 20 mg added today  Continue statin at discharge for risk factor management  Other Stroke Risk Factors  Advanced age  Other Active Problems  Elevated Creatinine - trending down after IVF's  Sinus Bradycardia - appear stable  Hospital day #  0  Brita RompMary A Rush, ANP-C Stroke Neurology Team 05/14/2017 3:53 PM   I reviewed above note and agree with the assessment and plan. Pt seen and examined. Wife and pt recounted HPI with me. Pt stated that he remembers last that he was getting out of his track with his dog. He remember next found he was in hospital bed. Wife stated that he got out of track, got in room and "feels not connected", "something was wrong". Not orientated when wife checked on him. In ER, pt kept asking "where am I" "why am here" "what happened", this went for cycles of every 2 min. Neuro exam perfectly normal this am. Stroke work up and EEG normal. Symptoms consistent with TGA. However, he was found to have hyperammonemia 62->44. On lactulose. High ammonia could be due to his high protein intake at home. LDL 111 and put on lipitor. Continue ASA 81mg  on discharge. Repeat ammonia in am. [facility].   I spent  35 minutes in total face-to-face time with the patient, more than 50% of which was spent in counseling and coordination of care, reviewing test results, images and medication, and discussing the diagnosis and differential diagnosis of amnesia and confusion, high ammonia levels, and treatment plan. This patient's care requiresreview of multiple databases, neurological assessment, discussion with family, other specialists and medical decision making of high complexity. I have discussed with Dr. Sunnie Nielsenegalado at bedside.   Marvel PlanJindong Inari Shin, MD PhD Stroke Neurology 05/14/2017 4:49 PM     To contact Stroke Continuity provider, please refer to [email or web address]. After hours, contact General Neurology

## 2017-05-14 NOTE — Evaluation (Signed)
Speech Language Pathology Evaluation Patient Details Name: Daniel Rush MRN: [id number] DOB: 1956/07/03 Today's Date: 05/14/2017 Time: [id number] SLP Time Calculation (min) (ACUTE ONLY): 22 min  Problem List:  Patient Active Problem List   Diagnosis Date Noted  . TIA (transient ischemic attack) 05/13/2017  . S/P cervical spinal fusion 07/06/2014  . Sinus bradycardia 02/01/2013  . Hypertension, essential 01/31/2013  . Atypical chest pain 01/31/2013   Past Medical History:  Past Medical History:  Diagnosis Date  . Arthritis    knees   . Asthma    rare use of an inhaler   . Hypertension   . Neuromuscular disorder (HCC)    spondylosis    Past Surgical History:  Past Surgical History:  Procedure Laterality Date  . ANTERIOR CERVICAL DECOMP/DISCECTOMY FUSION N/A 07/06/2014   Procedure: ANTERIOR CERVICAL DECOMPRESSION/DISCECTOMY FUSION 3 LEVELS;  Surgeon: Tia Alertavid S Jones, MD;  Location: [facility];  Service: Neurosurgery;  Laterality: N/A;  ANTERIOR CERVICAL DECOMPRESSION/DISCECTOMY FUSION 3 LEVELS  . CERVICAL DISC SURGERY  1999  . KNEE SURGERY Bilateral    X2 on R , x1 surg. on L - for ACL, & meniscus  . NECK SURGERY     C4-C5   HPI:  61 y.o. male with past medical history of hypertension presents with sudden onset confusion, antegrade amnesia presents to ER. MRI negative for acute stroke and MRA shows no LVO.   Assessment / Plan / Recommendation Clinical Impression  Pt scored 27/30 per the MOCA, considered WNL, with missed points in areas of orientation to time (date) and recall of 3/5 words from verbal list.  Pt and wife state that he is approaching baseline.  No SLP f/u is recommended - encouraged pt/wife to take notice of short-term memory function and to contact MD if difficulty persists.  They agree.  SLP will sign off.      SLP Assessment  SLP Recommendation/Assessment: Patient does not need any further Speech Lanaguage Pathology Services SLP Visit Diagnosis:  Cognitive communication deficit (R41.841)    Follow Up Recommendations       Frequency and Duration           SLP Evaluation Cognition  Overall Cognitive Status: Within Functional Limits for tasks assessed Arousal/Alertness: Awake/alert Orientation Level: Oriented to person;Oriented to place;Oriented to situation;Disoriented to time Attention: Selective Selective Attention: Appears intact Memory:  (3/5 recall of word list) Awareness: Appears intact Problem Solving: Appears intact       Comprehension  Auditory Comprehension Overall Auditory Comprehension: Appears within functional limits for tasks assessed Reading Comprehension Reading Status: Within funtional limits    Expression Expression Primary Mode of Expression: Verbal Verbal Expression Overall Verbal Expression: Appears within functional limits for tasks assessed Written Expression Written Expression: Within Functional Limits   Oral / Motor  Oral Motor/Sensory Function Overall Oral Motor/Sensory Function: Within functional limits Motor Speech Overall Motor Speech: Appears within functional limits for tasks assessed   GO          Functional Assessment Tool Used: MOCA Functional Limitations: Memory Memory Current Status (U1324(G9168): At least 1 percent but less than 20 percent impaired, limited or restricted Memory Goal Status (M0102(G9169): At least 1 percent but less than 20 percent impaired, limited or restricted Memory Discharge Status [phone](G9170): At least 1 percent but less than 20 percent impaired, limited or restricted         Blenda MountsCouture, Lizvette Lightsey Laurice 05/14/2017, 3:20 PM

## 2017-05-15 DIAGNOSIS — G459 Transient cerebral ischemic attack, unspecified: Secondary | ICD-10-CM | POA: Diagnosis not present

## 2017-05-15 DIAGNOSIS — I10 Essential (primary) hypertension: Secondary | ICD-10-CM | POA: Diagnosis not present

## 2017-05-15 LAB — BASIC METABOLIC PANEL
Anion gap: 5 (ref 5–15)
BUN: 13 mg/dL (ref 6–20)
CO2: 26 mmol/L (ref 22–32)
Calcium: 8.7 mg/dL — ABNORMAL LOW (ref 8.9–10.3)
Chloride: 108 mmol/L (ref 101–111)
Creatinine, Ser: 1 mg/dL (ref 0.61–1.24)
GFR calc Af Amer: >60 mL/min (ref >60)
GFR calc non Af Amer: >60 mL/min (ref >60)
Glucose, Bld: 89 mg/dL (ref 65–99)
Potassium: 4.1 mmol/L (ref 3.5–5.1)
Sodium: 139 mmol/L (ref 135–145)

## 2017-05-15 LAB — MAGNESIUM: Magnesium: 2.1 mg/dL (ref 1.7–2.4)

## 2017-05-15 LAB — AMMONIA: Ammonia: 43 umol/L — ABNORMAL HIGH (ref 9–35)

## 2017-05-15 MED ORDER — LACTULOSE 10 GM/15ML PO SOLN: 30.0000 g | Freq: Two times a day (BID) | ORAL | Status: DC

## 2017-05-15 MED ORDER — LACTULOSE 10 GM/15ML PO SOLN: 30.0000 g | Freq: Every day | ORAL | Status: DC

## 2017-05-15 MED ORDER — ASPIRIN 81 MG PO TBEC: 81.0000 mg | DELAYED_RELEASE_TABLET | Freq: Every day | ORAL | 0 refills | Status: AC

## 2017-05-15 MED ORDER — ATORVASTATIN CALCIUM 20 MG PO TABS: 20.0000 mg | ORAL_TABLET | Freq: Every day | ORAL | 0 refills | Status: AC

## 2017-05-15 MED ORDER — LACTULOSE 10 GM/15ML PO SOLN: 30.0000 g | Freq: Two times a day (BID) | ORAL | 0 refills | Status: AC

## 2017-05-15 NOTE — Progress Notes (Addendum)
Nutrition Brief Note  RD consulted regarding concern of protein intake causing elevated ammonia levels.   Wt Readings from Last 10 Encounters:  05/14/17 153 lb 11.2 oz (69.7 kg)  07/06/14 151 lb 13 oz (68.9 kg)  06/29/14 151 lb 12.8 oz (68.9 kg)  05/25/14 156 lb 4 oz (70.9 kg)  03/03/14 159 lb 2 oz (72.2 kg)  02/02/13 149 lb 4 oz (67.7 kg)   He says he takes 52 g worth of protein in addition to his meals. He says he eats 12-18 oz of protein per day in addition to his supplements. At most, this would be 178 g/day or 2.5g/kg bw.    Recent Labs Lab 05/13/17 1928 05/13/17 1943 05/14/17 0156 05/14/17 1233 05/15/17 0849  NA 137 138  --  138 139  K 3.5 3.5  --  4.1 4.1  CL 100* 101  --  105 108  CO2 27  --   --  26 26  BUN 26* 27*  --  15 13  CREATININE 1.40* 1.40* 1.10 1.03 1.00  CALCIUM 9.4  --   --  9.2 8.7*  MG  --   --   --   --  2.1  GLUCOSE 95 92  --  100* 89   Though this is very likely more than he requires, do not believe this is cause of elevated ammonia as he says he has been consuming this much protein "for decades" and this is the first time he has ever had this happen to him. Additionally, His BUN levels are not exceedingly high. Would anticipate these to be higher if protein intake was too extreme.   That said, He IS likely consuming too much protein given his CKD3 status, however, he says there has been no mention of his kidneys by the MD this admission. Therefore, RD did not feel it was appropriate telling him he has CKD3.  Would not go above 1.5 g/kg bw or ~100g   Christophe LouisNathan Franks RD, LDN, CNSC Clinical Nutrition Pager: [phone] 05/15/2017 1:40 PM

## 2017-05-15 NOTE — Discharge Summary (Signed)
Physician Discharge Summary  Daniel Rush:[id number] DOB: 25-Mar-1956 DOA: 05/13/2017  PCP: System, Provider Not In  Admit date: 05/13/2017 Discharge date: 05/15/2017  Admitted From: Home  Disposition:  Home   Recommendations for Outpatient Follow-up:  1. Follow up with PCP in 1-2 weeks 2. Please obtain BMP/CBC in one week 3. Please repeat ammonia level and re-evaluate needs for lactulose.     Discharge Condition: stable.  CODE STATUS: full code.  Diet recommendation: Heart Healthy  Brief/Interim Summary: Daniel Rush a 61 y.o.malewith history of hypertension, asthma was brought to the ER after patient's wife found that patient was having some memory loss. Last evening at around 5 PM patient had gone for walking with his wife and his dog. Patient's wife continue to walk but patient went back to his house with his dog. When patient's wife arrived back home patient was found to be confused and was not able to recall recent incidents. Patient did not have any loss of consciousness headache visual symptoms and difficulty speaking swallowing or any weakness of the extremities.  ED Course:In the ER MRI of the brain and MRA of the brain were unremarkable. Neurology on-call was consulted and patient is being admitted for further observation  Assessment & Plan:   Principal Problem:   TIA (transient ischemic attack) Active Problems:   Hypertension, essential   S/P cervical spinal fusion  Transient global Amnesia vs hyperammonemia;  EEG; normal.  Ammonia at 60-- repeated at 40--started lactulose. Levels at 40.  MRI negative for stroke.  LDL 111 started on statin. Marland Kitchen HB A1c 5.2. MRI negative  Aspirin.  ECHO; Normal LV systolic function; probable mild diastolic dysfunction Follow up wit neurology outpatient.   \HTN; continue with Cozaar,   AKI ; cr improved with IV fluids./   History of asthma.    Discharge Diagnoses:  Principal Problem:   TIA  (transient ischemic attack) Active Problems:   Hypertension, essential   S/P cervical spinal fusion   Transient global amnesia   Hyperlipidemia   Hyperammonemia [name])    Discharge Instructions  Discharge Instructions    Ambulatory referral to Neurology    Complete by:  As directed    An appointment is requested in approximately: 4 weeks   Diet - low sodium heart healthy    Complete by:  As directed    Increase activity slowly    Complete by:  As directed      Allergies as of 05/15/2017      Reactions   Latex Rash      Medication List    STOP taking these medications   HYDROcodone-acetaminophen 5-325 MG tablet Commonly known as:  NORCO/VICODIN   LECITHIN PO     TAKE these medications   albuterol 108 (90 Base) MCG/ACT inhaler Commonly known as:  PROVENTIL HFA;VENTOLIN HFA Inhale into the lungs every 6 (six) hours as needed for wheezing or shortness of breath.   ANDROGEL PUMP 20.25 MG/ACT (1.62%) Gel Generic drug:  Testosterone Apply 2 Act topically daily.   aspirin 81 MG EC tablet Take 1 tablet (81 mg total) by mouth daily.   atorvastatin 20 MG tablet Commonly known as:  LIPITOR Take 1 tablet (20 mg total) by mouth daily at 6 PM.   FISH OIL PO Take 1 capsule by mouth daily.   gabapentin 100 MG capsule Commonly known as:  NEURONTIN Take 3 capsules (300 mg total) by mouth at bedtime. What changed:  when to take this  reasons to take  this   lactulose 10 GM/15ML solution Commonly known as:  CHRONULAC Take 45 mLs (30 g total) by mouth 2 (two) times daily.   losartan-hydrochlorothiazide 50-12.5 MG tablet Commonly known as:  HYZAAR Take 1 tablet by mouth daily.   methocarbamol 500 MG tablet Commonly known as:  ROBAXIN Take 1 tablet (500 mg total) by mouth every 6 (six) hours as needed for muscle spasms.   multivitamin with minerals tablet Take 1 tablet by mouth daily.   valACYclovir 1000 MG tablet Commonly known as:  VALTREX Take 2,000 mg by  mouth every 12 (twelve) hours.   VITAMIN B-12 PO Take 1 tablet by mouth daily.   vitamin C 500 MG tablet Commonly known as:  ASCORBIC ACID Take 500 mg by mouth daily.   VITAMIN E PO Take 1 capsule by mouth daily.      Follow-up Information    Glendale Chardatel, Donika K, DO. Schedule an appointment as soon as possible for a visit in 4 week(s).   Specialty:  Neurology Contact information: [address] [place] [phone]        Johny BlamerHarris, William, MD Follow up in 1 week(s).   Specialty:  Family Medicine Contact information: [phone] [address] [place] [address] [phone]          Allergies  Allergen Reactions  . Latex Rash    Consultations:  Neurology    Procedures/Studies: Ct Angio Head W Or Wo Contrast  Result Date: 05/14/2017 CLINICAL DATA:  Acute onset confusion and memory loss. EXAM: CT ANGIOGRAPHY HEAD AND NECK TECHNIQUE: Multidetector CT imaging of the head and neck was performed using the standard protocol during bolus administration of intravenous contrast. Multiplanar CT image reconstructions and MIPs were obtained to evaluate the vascular anatomy. Carotid stenosis measurements (when applicable) are obtained utilizing NASCET criteria, using the distal internal carotid diameter as the denominator. CONTRAST:  50 mL Isovue 370 COMPARISON:  Head MRI/ MRA 05/13/2017 FINDINGS: CTA NECK FINDINGS Aortic arch: Standard 3 vessel aortic arch. Widely patent arch vessel origins. Right carotid system: Patent without evidence of dissection or significant stenosis. Minimal calcified plaque at the carotid bifurcation. Left carotid system: Patent without evidence of dissection or significant stenosis. Minimal, predominantly calcified plaque at the carotid bifurcation. Vertebral arteries: Patent with the left being dominant. Mild left vertebral artery origin stenosis due to mixed calcified and soft plaque. Skeleton: Solid C3-C6 ACDF. Moderate  disc space narrowing and degenerative endplate changes at C6-7. Other neck: No mass or lymph node enlargement. Upper chest: Clear lung apices. Review of the MIP images confirms the above findings CTA HEAD FINDINGS Anterior circulation: The internal carotid arteries are widely patent from skullbase to carotid termini. ACAs and MCAs are patent without evidence of proximal branch occlusion or significant stenosis. No aneurysm. Posterior circulation: The intracranial vertebral arteries are patent to the basilar. Patent bilateral PICA, left AICA, and bilateral SCA origins are identified. The basilar artery is widely patent. There are medium sized posterior communicating arteries bilaterally. PCAs are patent without evidence of significant stenosis. No aneurysm. Venous sinuses: Patent. Anatomic variants: None. Delayed phase: No abnormal enhancement. Review of the MIP images confirms the above findings IMPRESSION: 1. Negative head CTA. 2. Minimal cervical carotid artery atherosclerosis without stenosis. 3. Mild stenosis of the dominant left vertebral artery origin. Electronically Signed   By: Sebastian AcheAllen  Grady M.D.   On: 05/14/2017 08:42   Ct Angio Neck W Or Wo Contrast  Result Date: 05/14/2017 CLINICAL DATA:  Acute onset confusion and memory loss. EXAM: CT ANGIOGRAPHY HEAD AND NECK TECHNIQUE: Multidetector CT imaging of the head and neck was performed using the standard protocol during bolus administration of intravenous contrast. Multiplanar CT image reconstructions and MIPs were obtained to evaluate the vascular anatomy. Carotid stenosis measurements (when applicable) are obtained utilizing NASCET criteria, using the distal internal carotid diameter as the denominator. CONTRAST:  50 mL Isovue 370 COMPARISON:  Head MRI/ MRA 05/13/2017 FINDINGS: CTA NECK FINDINGS Aortic arch: Standard 3 vessel aortic arch. Widely patent arch vessel origins. Right carotid system: Patent without evidence of dissection or significant  stenosis. Minimal calcified plaque at the carotid bifurcation. Left carotid system: Patent without evidence of dissection or significant stenosis. Minimal, predominantly calcified plaque at the carotid bifurcation. Vertebral arteries: Patent with the left being dominant. Mild left vertebral artery origin stenosis due to mixed calcified and soft plaque. Skeleton: Solid C3-C6 ACDF. Moderate disc space narrowing and degenerative endplate changes at C6-7. Other neck: No mass or lymph node enlargement. Upper chest: Clear lung apices. Review of the MIP images confirms the above findings CTA HEAD FINDINGS Anterior circulation: The internal carotid arteries are widely patent from skullbase to carotid termini. ACAs and MCAs are patent without evidence of proximal branch occlusion or significant stenosis. No aneurysm. Posterior circulation: The intracranial vertebral arteries are patent to the basilar. Patent bilateral PICA, left AICA, and bilateral SCA origins are identified. The basilar artery is widely patent. There are medium sized posterior communicating arteries bilaterally. PCAs are patent without evidence of significant stenosis. No aneurysm. Venous sinuses: Patent. Anatomic variants: None. Delayed phase: No abnormal enhancement. Review of the MIP images confirms the above findings IMPRESSION: 1. Negative head CTA. 2. Minimal cervical carotid artery atherosclerosis without stenosis. 3. Mild stenosis of the dominant left vertebral artery origin. Electronically Signed   By: Sebastian Ache M.D.   On: 05/14/2017 08:42   Mr Maxine Glenn Head Wo Contrast  Result Date: 05/13/2017 CLINICAL DATA:  Initial evaluation for acute memory loss. EXAM: MRI HEAD WITHOUT CONTRAST MRA HEAD WITHOUT CONTRAST TECHNIQUE: Multiplanar, multiecho pulse sequences of the brain and surrounding structures were obtained without intravenous contrast. Angiographic images of the head were obtained using MRA technique without contrast. COMPARISON:  Prior CT  from earlier same day. FINDINGS: MRI HEAD FINDINGS Brain: Study degraded by motion artifact. Cerebral volume within normal limits for age. No significant cerebral white matter disease. No abnormal foci of restricted diffusion to suggest acute or subacute ischemia. Gray-white matter differentiation well maintained. No encephalomalacia to suggest chronic infarction. No evidence for acute or chronic intracranial hemorrhage. No mass lesion, midline shift or mass effect. No hydrocephalus. No extra-axial fluid collection. Major dural sinuses grossly patent. Pituitary gland suprasellar region normal. Midline structures intact and normal. Vascular: Major intravascular flow voids maintained. Skull and upper cervical spine: Craniocervical junction normal. Bone marrow signal intensity within normal limits. No scalp soft tissue abnormality. Sinuses/Orbits: Globes and orbital soft tissues within normal limits. Other: Moderate mucosal thickening within the ethmoidal air cells, left sphenoid sinus, and maxillary sinuses. No air-fluid level to suggest acute sinusitis. Trace bilateral mastoid effusions, right slightly larger than left, of doubtful significance. Inner ear structures normal. MRA HEAD FINDINGS ANTERIOR CIRCULATION: Study degraded by motion artifact. Distal cervical segments of the internal carotid arteries are patent with antegrade flow. Petrous, cavernous, and supraclinoid segments patent without hemodynamically significant stenosis. A1 segments patent bilaterally. Anterior communicating artery not well evaluated due to motion. Anterior cerebral arteries grossly patent to their distal  aspects without obvious stenosis. Patent M1 segments without definite stenosis, although evaluation limited by motion. Distal MCA branches grossly symmetric and well perfused. POSTERIOR CIRCULATION: Vertebral arteries patent to the vertebrobasilar junction without stenosis. Left vertebral artery dominant. Posterior inferior cerebral  arteries patent proximally. Basilar artery widely patent to its distal aspect. Superior cerebral arteries patent bilaterally. Both of the posterior cerebral arteries appear to be supplied via the basilar and are grossly perfused to their distal aspects, although evaluation limited by motion. IMPRESSION: MRI HEAD IMPRESSION: 1. Normal brain MRI for age. No acute intracranial abnormality identified. 2. Moderate paranasal sinus disease as above, likely allergic/inflammatory in nature. MRA HEAD IMPRESSION: 1. Motion degraded exam. 2. Grossly normal intracranial MRA. No large vessel occlusion. No obvious high-grade or correctable stenosis. Electronically Signed   By: Rise Mu M.D.   On: 05/13/2017 22:13   Mr Brain Wo Contrast  Result Date: 05/13/2017 CLINICAL DATA:  Initial evaluation for acute memory loss. EXAM: MRI HEAD WITHOUT CONTRAST MRA HEAD WITHOUT CONTRAST TECHNIQUE: Multiplanar, multiecho pulse sequences of the brain and surrounding structures were obtained without intravenous contrast. Angiographic images of the head were obtained using MRA technique without contrast. COMPARISON:  Prior CT from earlier same day. FINDINGS: MRI HEAD FINDINGS Brain: Study degraded by motion artifact. Cerebral volume within normal limits for age. No significant cerebral white matter disease. No abnormal foci of restricted diffusion to suggest acute or subacute ischemia. Gray-white matter differentiation well maintained. No encephalomalacia to suggest chronic infarction. No evidence for acute or chronic intracranial hemorrhage. No mass lesion, midline shift or mass effect. No hydrocephalus. No extra-axial fluid collection. Major dural sinuses grossly patent. Pituitary gland suprasellar region normal. Midline structures intact and normal. Vascular: Major intravascular flow voids maintained. Skull and upper cervical spine: Craniocervical junction normal. Bone marrow signal intensity within normal limits. No scalp soft  tissue abnormality. Sinuses/Orbits: Globes and orbital soft tissues within normal limits. Other: Moderate mucosal thickening within the ethmoidal air cells, left sphenoid sinus, and maxillary sinuses. No air-fluid level to suggest acute sinusitis. Trace bilateral mastoid effusions, right slightly larger than left, of doubtful significance. Inner ear structures normal. MRA HEAD FINDINGS ANTERIOR CIRCULATION: Study degraded by motion artifact. Distal cervical segments of the internal carotid arteries are patent with antegrade flow. Petrous, cavernous, and supraclinoid segments patent without hemodynamically significant stenosis. A1 segments patent bilaterally. Anterior communicating artery not well evaluated due to motion. Anterior cerebral arteries grossly patent to their distal aspects without obvious stenosis. Patent M1 segments without definite stenosis, although evaluation limited by motion. Distal MCA branches grossly symmetric and well perfused. POSTERIOR CIRCULATION: Vertebral arteries patent to the vertebrobasilar junction without stenosis. Left vertebral artery dominant. Posterior inferior cerebral arteries patent proximally. Basilar artery widely patent to its distal aspect. Superior cerebral arteries patent bilaterally. Both of the posterior cerebral arteries appear to be supplied via the basilar and are grossly perfused to their distal aspects, although evaluation limited by motion. IMPRESSION: MRI HEAD IMPRESSION: 1. Normal brain MRI for age. No acute intracranial abnormality identified. 2. Moderate paranasal sinus disease as above, likely allergic/inflammatory in nature. MRA HEAD IMPRESSION: 1. Motion degraded exam. 2. Grossly normal intracranial MRA. No large vessel occlusion. No obvious high-grade or correctable stenosis. Electronically Signed   By: Rise Mu M.D.   On: 05/13/2017 22:13   Ct Head Code Stroke Wo Contrast  Result Date: 05/13/2017 CLINICAL DATA:  Code stroke. Initial  evaluation for acute altered mental status. EXAM: CT HEAD WITHOUT CONTRAST TECHNIQUE: Contiguous axial images  were obtained from the base of the skull through the vertex without intravenous contrast. COMPARISON:  None available. FINDINGS: Brain: Cerebral volume within normal limits for patient age. No evidence for acute intracranial hemorrhage. No findings to suggest acute large vessel territory infarct. No mass lesion, midline shift, or mass effect. Ventricles are normal in size without evidence for hydrocephalus. No extra-axial fluid collection identified. Vascular: No hyperdense vessel identified. Skull: Scalp soft tissues demonstrate no acute abnormality.Calvarium intact. Sinuses/Orbits: Globes and orbital soft tissues are within normal limits. Mucosal thickening seen within the ethmoidal air cells bilaterally as well as the left sphenoid and visualized left maxillary sinus. No air-fluid level to suggest acute sinusitis. Trace bilateral mastoid effusions ASPECTS (Alberta Stroke Program Early CT Score) - Ganglionic level infarction (caudate, lentiform nuclei, internal capsule, insula, M1-M3 cortex): 7 - Supraganglionic infarction (M4-M6 cortex): 3 Total score (0-10 with 10 being normal): 10 IMPRESSION: 1. Normal head CT.  No acute intracranial abnormality identified. 2. ASPECTS is 10. Critical Value/emergent results were called by telephone at the time of interpretation on 05/13/2017 at 8:26 pm to Dr. Bary Castilla , who verbally acknowledged these results. Electronically Signed   By: Rise Mu M.D.   On: 05/13/2017 20:27    (Echo, Carotid, EGD, Colonoscopy, ERCP)    Subjective:   Discharge Exam: Vitals:   05/15/17 0505 05/15/17 1049  BP: 132/73 138/75  Pulse: (!) 47 (!) 50  Resp: 18 18  Temp: 97.7 F (36.5 C) 98.9 F (37.2 C)  SpO2: 98% 98%   Vitals:   05/14/17 2120 05/15/17 0033 05/15/17 0505 05/15/17 1049  BP: 136/68 133/64 132/73 138/75  Pulse: (!) 55 (!) 59 (!) 47 (!) 50   Resp: 20 20 18 18   Temp: 97.9 F (36.6 C) 98 F (36.7 C) 97.7 F (36.5 C) 98.9 F (37.2 C)  TempSrc: Oral Oral Oral Oral  SpO2: 100% 100% 98% 98%  Weight:      Height:        General: Pt is alert, awake, not in acute distress Cardiovascular: RRR, S1/S2 +, no rubs, no gallops Respiratory: CTA bilaterally, no wheezing, no rhonchi Abdominal: Soft, NT, ND, bowel sounds + Extremities: no edema, no cyanosis    The results of significant diagnostics from this hospitalization (including imaging, microbiology, ancillary and laboratory) are listed below for reference.     Microbiology: No results found for this or any previous visit (from the past 240 hour(s)).   Labs: BNP (last 3 results) No results for input(s): BNP in the last 8760 hours. Basic Metabolic Panel:  Recent Labs Lab 05/13/17 1928 05/13/17 1943 05/14/17 0156 05/14/17 1233 05/15/17 0849  NA 137 138  --  138 139  K 3.5 3.5  --  4.1 4.1  CL 100* 101  --  105 108  CO2 27  --   --  26 26  GLUCOSE 95 92  --  100* 89  BUN 26* 27*  --  15 13  CREATININE 1.40* 1.40* 1.10 1.03 1.00  CALCIUM 9.4  --   --  9.2 8.7*  MG  --   --   --   --  2.1   Liver Function Tests:  Recent Labs Lab 05/13/17 1928  AST 39  ALT 27  ALKPHOS 60  BILITOT 0.7  PROT 7.7  ALBUMIN 4.1   No results for input(s): LIPASE, AMYLASE in the last 168 hours.  Recent Labs Lab 05/14/17 0846 05/14/17 1233 05/15/17 0541  AMMONIA 62* 44* 43*  CBC:  Recent Labs Lab 05/13/17 1928 05/13/17 1943 05/14/17 0156  WBC 10.4  --  8.9  NEUTROABS 4.5  --   --   HGB 15.8 16.0 15.2  HCT 45.4 47.0 43.1  MCV 91.7  --  90.7  PLT 254  --  228   Cardiac Enzymes: No results for input(s): CKTOTAL, CKMB, CKMBINDEX, TROPONINI in the last 168 hours. BNP: Invalid input(s): POCBNP CBG:  Recent Labs Lab 05/13/17 1919 05/13/17 2107  GLUCAP 84 86   D-Dimer No results for input(s): DDIMER in the last 72 hours. Hgb A1c  Recent Labs   05/14/17 0156  HGBA1C 5.2   Lipid Profile  Recent Labs  05/14/17 0156  CHOL 198  HDL 72  LDLCALC 111*  TRIG 76  CHOLHDL 2.8   Thyroid function studies No results for input(s): TSH, T4TOTAL, T3FREE, THYROIDAB in the last 72 hours.  Invalid input(s): FREET3 Anemia work up No results for input(s): VITAMINB12, FOLATE, FERRITIN, TIBC, IRON, RETICCTPCT in the last 72 hours. Urinalysis No results found for: COLORURINE, APPEARANCEUR, LABSPEC, PHURINE, GLUCOSEU, HGBUR, BILIRUBINUR, KETONESUR, PROTEINUR, UROBILINOGEN, NITRITE, LEUKOCYTESUR Sepsis Labs Invalid input(s): PROCALCITONIN,  WBC,  LACTICIDVEN Microbiology No results found for this or any previous visit (from the past 240 hour(s)).   Time coordinating discharge: Over 30 minutes  SIGNED:   Alba Cory, MD  [facility] 05/15/2017, 11:51 AM Pager   If 7PM-7AM, please contact night-coverage [email or web address] Password [email or web address]

## 2017-05-15 NOTE — Care Management Note (Signed)
Case Management Note  Patient Details  Name: Daniel Rush MRN: [id number] Date of Birth: 1955-11-10  Subjective/Objective:                    Action/Plan: Pt discharging home with self care. Pt has PCP ([facility] Tiburcio [facility]Harris [facility]), insurance and transportation home. No further needs per CM.   Expected Discharge Date:  05/15/17               Expected Discharge Plan:  Home/Self Care  In-House Referral:     Discharge planning Services     Post Acute Care Choice:    Choice offered to:     DME Arranged:    DME Agency:     HH Arranged:    HH Agency:     Status of Service:  Completed, signed off  If discussed at [employer] Length of Stay Meetings, dates discussed:    Additional Comments:  [name], RN 05/15/2017, 1:32 PM

## 2017-05-15 NOTE — Progress Notes (Signed)
Pt discharged at this time.  Verbalizes understanding of all discharge instructions, and prescriptions.  He did have dietary consult before discharge.  Has all belongings with him.  Escorted out and will be transported home via car with wife.

## 2017-05-15 NOTE — Progress Notes (Addendum)
STROKE TEAM PROGRESS NOTE   SUBJECTIVE (INTERVAL HISTORY) His wife is at the bedside.  Overall he feels his condition is completely resolved. Patient again states he feels he is at his mentation baseline. His affect has improved, smiling and more interactive with examiner today. He voices no new complaints and no new events reported overnight. States he has had 3-4 small bowel movements since Lactulose treatment started. Wife states she remembered another event that might explain patients symptoms. Patient has been spray painting with industrial paint daily for the past 2 weeks. He states he was spraying outside with good ventilation so he never thought to wear a mask. He denies any immediate effects from the spraying such as coughing, SOB, H/A, sore throat, etc  OBJECTIVE Temp:  [97.1 F (36.2 C)-98 F (36.7 C)] 97.7 F (36.5 C) (10/26 0505) Pulse Rate:  [47-59] 47 (10/26 0505) Cardiac Rhythm: Sinus bradycardia (10/26 0700) Resp:  [18-20] 18 (10/26 0505) BP: (132-164)/(64-87) 132/73 (10/26 0505) SpO2:  [97 %-100 %] 98 % (10/26 0505)   Recent Labs Lab 05/13/17 1919 05/13/17 2107  GLUCAP 84 86    Recent Labs Lab 05/13/17 1928 05/13/17 1943 05/14/17 0156 05/14/17 1233 05/15/17 0849  NA 137 138  --  138 139  K 3.5 3.5  --  4.1 4.1  CL 100* 101  --  105 108  CO2 27  --   --  26 26  GLUCOSE 95 92  --  100* 89  BUN 26* 27*  --  15 13  CREATININE 1.40* 1.40* 1.10 1.03 1.00  CALCIUM 9.4  --   --  9.2 8.7*  MG  --   --   --   --  2.1    Recent Labs Lab 05/13/17 1928  AST 39  ALT 27  ALKPHOS 60  BILITOT 0.7  PROT 7.7  ALBUMIN 4.1    Recent Labs Lab 05/13/17 1928 05/13/17 1943 05/14/17 0156  WBC 10.4  --  8.9  NEUTROABS 4.5  --   --   HGB 15.8 16.0 15.2  HCT 45.4 47.0 43.1  MCV 91.7  --  90.7  PLT 254  --  228   No results for input(s): CKTOTAL, CKMB, CKMBINDEX, TROPONINI in the last 168 hours.  Recent Labs  05/13/17 1928  LABPROT 12.5  INR 0.94   No  results for input(s): COLORURINE, LABSPEC, PHURINE, GLUCOSEU, HGBUR, BILIRUBINUR, KETONESUR, PROTEINUR, UROBILINOGEN, NITRITE, LEUKOCYTESUR in the last 72 hours.  Invalid input(s): APPERANCEUR     Component Value Date/Time   CHOL 198 05/14/2017 0156   TRIG 76 05/14/2017 0156   HDL 72 05/14/2017 0156   CHOLHDL 2.8 05/14/2017 0156   VLDL 15 05/14/2017 0156   LDLCALC 111 (H) 05/14/2017 0156   Lab Results  Component Value Date   HGBA1C 5.2 05/14/2017   No results found for: LABOPIA, COCAINSCRNUR, LABBENZ, AMPHETMU, THCU, LABBARB  No results for input(s): ETH in the last 168 hours.  I have personally reviewed the radiological images below and agree with the radiology interpretations.  Ct Angio Head W Or Wo Contrast Result Date: 05/14/2017 IMPRESSION: 1. Negative head CTA. 2. Minimal cervical carotid artery atherosclerosis without stenosis. 3. Mild stenosis of the dominant left vertebral artery origin.   Ct Angio Neck W Or Wo Contrast Result Date: 05/14/2017 IMPRESSION: 1. Negative head CTA. 2. Minimal cervical carotid artery atherosclerosis without stenosis. 3. Mild stenosis of the dominant left vertebral artery origin.  Mr Maxine GlennMra Head Wo Contrast Result Date: 05/13/2017  IMPRESSION: MRI HEAD IMPRESSION: 1. Normal brain MRI for age. No acute intracranial abnormality identified. 2. Moderate paranasal sinus disease as above, likely allergic/inflammatory in nature. MRA HEAD IMPRESSION: 1. Motion degraded exam. 2. Grossly normal intracranial MRA. No large vessel occlusion. No obvious high-grade or correctable stenosis. Electronically Signed   By: Rise Mu M.D.   On: 05/13/2017 22:13   Mr Brain Wo Contrast Result Date: 05/13/2017 IMPRESSION: MRI HEAD IMPRESSION: 1. Normal brain MRI for age. No acute intracranial abnormality identified. 2. Moderate paranasal sinus disease as above, likely allergic/inflammatory in nature. MRA HEAD IMPRESSION: 1. Motion degraded exam. 2. Grossly normal  intracranial MRA. No large vessel occlusion. No obvious high-grade or correctable stenosis.   Ct Head Code Stroke Wo Contrast Result Date: 05/13/2017 IMPRESSION: 1. Normal head CT.  No acute intracranial abnormality identified. 2. ASPECTS is 10.   EEG  This awake and asleep EEG is normal. A normal EEG does not exclude a clinical diagnosis of epilepsy  2D Echocardiogram  LVEF 55-60%, Normal LV systolic function; probable mild diastolic dysfunction   PHYSICAL EXAM  Temp:  [97.1 F (36.2 C)-98 F (36.7 C)] 97.7 F (36.5 C) (10/26 0505) Pulse Rate:  [47-59] 47 (10/26 0505) Resp:  [18-20] 18 (10/26 0505) BP: (132-164)/(64-87) 132/73 (10/26 0505) SpO2:  [97 %-100 %] 98 % (10/26 0505)  General - Well nourished, well developed, in no apparent distress Respiratory - Lungs clear bilaterally. No wheezing Cardiovascular - Regular rate and rhythm with no murmur  Mental Status -  Level of arousal and orientation to time, place, and person were intact Language including expression, naming, repetition, comprehension was assessed and found intact Attention span and concentration were normal Recent and remote memory were intact. Fund of Knowledge was assessed and was intact. More interactive with examiner today, smiling  Cranial Nerves II - XII - II - Visual field intact OU III, IV, VI - Extraocular movements intact V - Facial sensation intact bilaterally VII - Facial movement intact bilaterally VIII - Hearing & vestibular intact bilaterally X - Palate elevates symmetrically XI - Chin turning & shoulder shrug intact bilaterally XII - Tongue protrusion intact  Motor Strength - The patient's strength was normal in all extremities and pronator drift was absent.  Bulk was normal and fasciculations were absent.   Motor Tone - Muscle tone was assessed at the neck and appendages and was normal  Reflexes - The patient's reflexes were symmetrical in all extremities and he had no pathological  reflexes  Sensory - Light touch was symmetrical    Coordination - The patient had normal movements in the hands and feet with no ataxia or dysmetria.  Tremor was absent  Gait and Station - deferred.   ASSESSMENT/PLAN Mr. AYDRIEN FROMAN is a 61 y.o. male with history of HTN admitted for sudden onset of confusion. Patient is at mentation baseline on exam today. Patients Ammonia level is improving slowly - 42 today. Lactulose treatment in progress with 3-4 BM's documented. Additional history obtained from the wife today: Patient has been spray painting with industrial paint daily for the past 2 weeks. He states he was spraying outside with good ventilation so he never thought to wear a mask. He denies any immediate effects from the spraying such as coughing, SOB, H/A, sore throat, etc. Informed patient and wife the paint exposure is unlikely to have increased the ammonia level. Patient request a Reg Dietitian consult for proper protein intake.   Acute memory loss - TGA vs. Hyperammonemia  secondary to excessive protein intake? Symptoms consistent with TGA. However, he was found to have hyperammonemia 62->44->43. On lactulose. High ammonia could be due to his high protein intake at home.  MRI/ MRA Head - No acute intracranial abnormality identified. No large vessel occlusion/stenosis  CTA Head/Neck  -Negative  2D Echo  LVEF 55-60%  EEG normal  LDL 111  HgbA1c 5.2  Lovenox for VTE prophylaxis  Diet Heart Room service appropriate? Yes; Fluid consistency: Thin   No antithrombotic prior to admission, now on aspirin 81 mg daily and to be continued at discharge for risk factor management  Patient counseled to be compliant with his antithrombotic medications  Ongoing aggressive stroke risk factor management  Therapy recommendations:  No therapy needed at this time  Disposition:  Likely HOME in AM if improving. Patient will need close follow up with PCP. Follow up Neurology - Dr Nita Sickle in 4 weeks.  Hyperammonemia  Could be related to excessive protein intake  Level trending down slowly and Lactulose given  Ammonia 62->44->43  Registered dietitian consultation for recs on protein intake   Recommend close follow up with PCP for ammonia monitoring  Hypertension Stable Permissive hypertension (OK if <220/120) for 24-48 hours post stroke and then gradually normalized within 5-7 days.  Long term BP goal normotensive, Restart home B/P meds prior to discharge  Hyperlipidemia  Home meds:  None  LDL 111, goal < 90  Lipitor 20 mg added today  Continue statin at discharge for risk factor management  Other Stroke Risk Factors  Advanced age  Other Active Problems  Elevated Creatinine - continues to trend down   Sinus Bradycardia - appear stable  Hospital day # 0  Brita Romp Stroke Neurology Team 05/15/2017 10:18 AM   I reviewed above note and agree with the assessment and plan. Pt seen and examined. Discussed with pt and wife at bedside. Etiology could be TGA vs. Hyperammonemia. Will continue lactulose and monitor ammonia with PCP. Continue ASA 81mg  and lipitor. Neurology will sign off. Please call with questions. Pt will follow up with Dr. Allena Katz at [facility] in 4 weeks. Thanks for the consult.   Marvel Plan, MD PhD Stroke Neurology 05/15/2017 12:50 PM     To contact Stroke Continuity provider, please refer to [email or web address]. After hours, contact General Neurology

## 2017-05-15 NOTE — Progress Notes (Signed)
OT Note    05/15/17 0800  OT Visit Information  Last OT Received On 05/15/17  Reason Eval/Treat Not Completed OT screened, no needs identified, will sign off  [facility] Afsana Liera, OT/L  [phone] 05/15/2017

## 2017-05-22 DIAGNOSIS — T561X1D Toxic effect of mercury and its compounds, accidental (unintentional), subsequent encounter: Secondary | ICD-10-CM | POA: Diagnosis not present

## 2017-05-22 DIAGNOSIS — E7220 Disorder of urea cycle metabolism, unspecified: Secondary | ICD-10-CM | POA: Diagnosis not present

## 2017-05-22 DIAGNOSIS — I10 Essential (primary) hypertension: Secondary | ICD-10-CM | POA: Diagnosis not present

## 2017-06-26 DIAGNOSIS — E291 Testicular hypofunction: Secondary | ICD-10-CM | POA: Diagnosis not present

## 2017-07-03 DIAGNOSIS — E291 Testicular hypofunction: Secondary | ICD-10-CM | POA: Diagnosis not present

## 2017-08-21 ENCOUNTER — Ambulatory Visit: Payer: BLUE CROSS/BLUE SHIELD | Admitting: Neurology

## 2017-10-21 DIAGNOSIS — J029 Acute pharyngitis, unspecified: Secondary | ICD-10-CM | POA: Diagnosis not present

## 2017-10-21 DIAGNOSIS — E7800 Pure hypercholesterolemia, unspecified: Secondary | ICD-10-CM | POA: Diagnosis not present

## 2017-10-21 DIAGNOSIS — E291 Testicular hypofunction: Secondary | ICD-10-CM | POA: Diagnosis not present

## 2017-10-21 DIAGNOSIS — I10 Essential (primary) hypertension: Secondary | ICD-10-CM | POA: Diagnosis not present

## 2017-11-05 DIAGNOSIS — M1712 Unilateral primary osteoarthritis, left knee: Secondary | ICD-10-CM | POA: Diagnosis not present

## 2017-11-05 DIAGNOSIS — M1711 Unilateral primary osteoarthritis, right knee: Secondary | ICD-10-CM | POA: Diagnosis not present

## 2017-11-05 DIAGNOSIS — M79642 Pain in left hand: Secondary | ICD-10-CM | POA: Diagnosis not present

## 2017-11-05 DIAGNOSIS — M25561 Pain in right knee: Secondary | ICD-10-CM | POA: Diagnosis not present

## 2017-11-05 DIAGNOSIS — M25562 Pain in left knee: Secondary | ICD-10-CM | POA: Diagnosis not present

## 2017-12-03 DIAGNOSIS — M25461 Effusion, right knee: Secondary | ICD-10-CM | POA: Diagnosis not present

## 2017-12-16 DIAGNOSIS — Z01812 Encounter for preprocedural laboratory examination: Secondary | ICD-10-CM | POA: Diagnosis not present

## 2017-12-28 DIAGNOSIS — M25461 Effusion, right knee: Secondary | ICD-10-CM | POA: Diagnosis not present

## 2017-12-30 DIAGNOSIS — M1711 Unilateral primary osteoarthritis, right knee: Secondary | ICD-10-CM | POA: Diagnosis not present

## 2017-12-30 DIAGNOSIS — Z96651 Presence of right artificial knee joint: Secondary | ICD-10-CM | POA: Diagnosis not present

## 2017-12-30 DIAGNOSIS — Z472 Encounter for removal of internal fixation device: Secondary | ICD-10-CM | POA: Diagnosis not present

## 2018-01-04 DIAGNOSIS — M1711 Unilateral primary osteoarthritis, right knee: Secondary | ICD-10-CM | POA: Diagnosis not present

## 2018-01-06 DIAGNOSIS — M1711 Unilateral primary osteoarthritis, right knee: Secondary | ICD-10-CM | POA: Diagnosis not present

## 2018-01-08 DIAGNOSIS — M1711 Unilateral primary osteoarthritis, right knee: Secondary | ICD-10-CM | POA: Diagnosis not present

## 2018-01-11 DIAGNOSIS — M1711 Unilateral primary osteoarthritis, right knee: Secondary | ICD-10-CM | POA: Diagnosis not present

## 2018-01-11 DIAGNOSIS — M25561 Pain in right knee: Secondary | ICD-10-CM | POA: Diagnosis not present

## 2018-01-13 DIAGNOSIS — M1711 Unilateral primary osteoarthritis, right knee: Secondary | ICD-10-CM | POA: Diagnosis not present

## 2018-01-15 DIAGNOSIS — M1711 Unilateral primary osteoarthritis, right knee: Secondary | ICD-10-CM | POA: Diagnosis not present

## 2018-01-18 DIAGNOSIS — M1711 Unilateral primary osteoarthritis, right knee: Secondary | ICD-10-CM | POA: Diagnosis not present

## 2018-01-20 DIAGNOSIS — M1711 Unilateral primary osteoarthritis, right knee: Secondary | ICD-10-CM | POA: Diagnosis not present

## 2018-01-22 DIAGNOSIS — M1711 Unilateral primary osteoarthritis, right knee: Secondary | ICD-10-CM | POA: Diagnosis not present

## 2018-01-25 DIAGNOSIS — M1711 Unilateral primary osteoarthritis, right knee: Secondary | ICD-10-CM | POA: Diagnosis not present

## 2018-01-27 DIAGNOSIS — M1711 Unilateral primary osteoarthritis, right knee: Secondary | ICD-10-CM | POA: Diagnosis not present

## 2018-01-28 DIAGNOSIS — Z9889 Other specified postprocedural states: Secondary | ICD-10-CM | POA: Diagnosis not present

## 2018-01-29 DIAGNOSIS — M1711 Unilateral primary osteoarthritis, right knee: Secondary | ICD-10-CM | POA: Diagnosis not present

## 2018-02-01 DIAGNOSIS — M1711 Unilateral primary osteoarthritis, right knee: Secondary | ICD-10-CM | POA: Diagnosis not present

## 2018-02-03 DIAGNOSIS — M1711 Unilateral primary osteoarthritis, right knee: Secondary | ICD-10-CM | POA: Diagnosis not present

## 2018-02-04 DIAGNOSIS — E291 Testicular hypofunction: Secondary | ICD-10-CM | POA: Diagnosis not present

## 2018-02-05 DIAGNOSIS — M1711 Unilateral primary osteoarthritis, right knee: Secondary | ICD-10-CM | POA: Diagnosis not present

## 2018-02-08 DIAGNOSIS — M1711 Unilateral primary osteoarthritis, right knee: Secondary | ICD-10-CM | POA: Diagnosis not present

## 2018-02-10 DIAGNOSIS — M1711 Unilateral primary osteoarthritis, right knee: Secondary | ICD-10-CM | POA: Diagnosis not present

## 2018-02-15 DIAGNOSIS — M1711 Unilateral primary osteoarthritis, right knee: Secondary | ICD-10-CM | POA: Diagnosis not present

## 2018-02-17 DIAGNOSIS — M1711 Unilateral primary osteoarthritis, right knee: Secondary | ICD-10-CM | POA: Diagnosis not present

## 2018-03-04 DIAGNOSIS — M722 Plantar fascial fibromatosis: Secondary | ICD-10-CM | POA: Diagnosis not present

## 2018-03-04 DIAGNOSIS — M25561 Pain in right knee: Secondary | ICD-10-CM | POA: Diagnosis not present

## 2018-04-22 DIAGNOSIS — Z23 Encounter for immunization: Secondary | ICD-10-CM | POA: Diagnosis not present

## 2018-04-22 DIAGNOSIS — E7800 Pure hypercholesterolemia, unspecified: Secondary | ICD-10-CM | POA: Diagnosis not present

## 2018-04-22 DIAGNOSIS — I10 Essential (primary) hypertension: Secondary | ICD-10-CM | POA: Diagnosis not present

## 2018-04-22 DIAGNOSIS — T561X1D Toxic effect of mercury and its compounds, accidental (unintentional), subsequent encounter: Secondary | ICD-10-CM | POA: Diagnosis not present

## 2018-04-22 DIAGNOSIS — E291 Testicular hypofunction: Secondary | ICD-10-CM | POA: Diagnosis not present

## 2018-04-22 DIAGNOSIS — Z125 Encounter for screening for malignant neoplasm of prostate: Secondary | ICD-10-CM | POA: Diagnosis not present

## 2018-04-29 DIAGNOSIS — M79671 Pain in right foot: Secondary | ICD-10-CM | POA: Diagnosis not present

## 2018-04-29 DIAGNOSIS — M722 Plantar fascial fibromatosis: Secondary | ICD-10-CM | POA: Diagnosis not present

## 2018-10-22 DIAGNOSIS — I10 Essential (primary) hypertension: Secondary | ICD-10-CM | POA: Diagnosis not present

## 2018-10-22 DIAGNOSIS — E291 Testicular hypofunction: Secondary | ICD-10-CM | POA: Diagnosis not present

## 2018-10-22 DIAGNOSIS — L301 Dyshidrosis [pompholyx]: Secondary | ICD-10-CM | POA: Diagnosis not present

## 2018-10-22 DIAGNOSIS — E7800 Pure hypercholesterolemia, unspecified: Secondary | ICD-10-CM | POA: Diagnosis not present

## 2019-05-05 DIAGNOSIS — I10 Essential (primary) hypertension: Secondary | ICD-10-CM | POA: Diagnosis not present

## 2019-05-05 DIAGNOSIS — Z125 Encounter for screening for malignant neoplasm of prostate: Secondary | ICD-10-CM | POA: Diagnosis not present

## 2019-05-05 DIAGNOSIS — E7800 Pure hypercholesterolemia, unspecified: Secondary | ICD-10-CM | POA: Diagnosis not present

## 2019-05-05 DIAGNOSIS — Z23 Encounter for immunization: Secondary | ICD-10-CM | POA: Diagnosis not present

## 2019-05-05 DIAGNOSIS — E291 Testicular hypofunction: Secondary | ICD-10-CM | POA: Diagnosis not present

## 2019-10-06 ENCOUNTER — Ambulatory Visit: Payer: BC Managed Care – PPO | Attending: Internal Medicine

## 2019-10-06 DIAGNOSIS — Z23 Encounter for immunization: Secondary | ICD-10-CM

## 2019-10-06 NOTE — Progress Notes (Signed)
   Covid-19 Vaccination Clinic  Name:  TRYSTYN DOLLEY    MRN: [id number] DOB: Aug 31, 1955  10/06/2019  Mr. Cassetta was observed post Covid-19 immunization for 15 minutes without incident. He was provided with Vaccine Information Sheet and instruction to access the V-Safe system.   Mr. Rondon was instructed to call 911 with any severe reactions post vaccine: Marland Kitchen Difficulty breathing  . Swelling of face and throat  . A fast heartbeat  . A bad rash all over body  . Dizziness and weakness   Immunizations Administered    Name Date Dose VIS Date Route   Pfizer COVID-19 Vaccine 10/06/2019  1:51 PM 0.3 mL 07/01/2019 Intramuscular   Manufacturer: [employer], [employer]   Lot: [id number]   NDC: [address]-6

## 2019-10-31 ENCOUNTER — Ambulatory Visit: Payer: BC Managed Care – PPO | Attending: Internal Medicine

## 2019-10-31 DIAGNOSIS — Z23 Encounter for immunization: Secondary | ICD-10-CM

## 2019-10-31 NOTE — Progress Notes (Signed)
   Covid-19 Vaccination Clinic  Name:  Daniel Rush    MRN: [id number] DOB: 11-12-1955  10/31/2019  [name] was observed post Covid-19 immunization for 15 minutes without incident. He was provided with Vaccine Information Sheet and instruction to access the V-Safe system.   [name] was instructed to call 911 with any severe reactions post vaccine: Marland Kitchen Difficulty breathing  . Swelling of face and throat  . A fast heartbeat  . A bad rash all over body  . Dizziness and weakness   Immunizations Administered    Name Date Dose VIS Date Route   Pfizer COVID-19 Vaccine 10/31/2019  2:12 PM 0.3 mL 07/01/2019 Intramuscular   Manufacturer: [employer], [employer]   Lot: [id number]   NDC: [address]-0

## 2019-11-03 ENCOUNTER — Ambulatory Visit
Admission: RE | Admit: 2019-11-03 | Discharge: 2019-11-03 | Disposition: A | Discharge status: Home / Self Care | Payer: BC Managed Care – PPO | Source: Ambulatory Visit | Attending: Family Medicine | Admitting: Family Medicine

## 2019-11-03 ENCOUNTER — Other Ambulatory Visit: Payer: Self-pay

## 2019-11-03 ENCOUNTER — Other Ambulatory Visit: Payer: Self-pay | Admitting: Family Medicine

## 2019-11-03 DIAGNOSIS — M542 Cervicalgia: Secondary | ICD-10-CM | POA: Diagnosis not present

## 2019-11-03 DIAGNOSIS — R29898 Other symptoms and signs involving the musculoskeletal system: Secondary | ICD-10-CM | POA: Diagnosis not present

## 2019-11-03 DIAGNOSIS — I10 Essential (primary) hypertension: Secondary | ICD-10-CM | POA: Diagnosis not present

## 2019-11-03 DIAGNOSIS — R0781 Pleurodynia: Secondary | ICD-10-CM | POA: Diagnosis not present

## 2019-11-03 DIAGNOSIS — E7800 Pure hypercholesterolemia, unspecified: Secondary | ICD-10-CM | POA: Diagnosis not present

## 2019-11-03 DIAGNOSIS — R079 Chest pain, unspecified: Secondary | ICD-10-CM | POA: Diagnosis not present

## 2019-11-03 DIAGNOSIS — M549 Dorsalgia, unspecified: Secondary | ICD-10-CM | POA: Diagnosis not present

## 2019-12-20 DIAGNOSIS — M545 Low back pain: Secondary | ICD-10-CM | POA: Diagnosis not present

## 2020-03-06 DIAGNOSIS — M546 Pain in thoracic spine: Secondary | ICD-10-CM | POA: Diagnosis not present

## 2020-03-15 DIAGNOSIS — M4722 Other spondylosis with radiculopathy, cervical region: Secondary | ICD-10-CM | POA: Diagnosis not present

## 2020-03-15 DIAGNOSIS — M542 Cervicalgia: Secondary | ICD-10-CM | POA: Diagnosis not present

## 2020-04-14 DIAGNOSIS — M25562 Pain in left knee: Secondary | ICD-10-CM | POA: Diagnosis not present

## 2020-04-14 DIAGNOSIS — M542 Cervicalgia: Secondary | ICD-10-CM | POA: Diagnosis not present

## 2020-04-23 DIAGNOSIS — M542 Cervicalgia: Secondary | ICD-10-CM | POA: Diagnosis not present

## 2020-05-03 DIAGNOSIS — I10 Essential (primary) hypertension: Secondary | ICD-10-CM | POA: Diagnosis not present

## 2020-05-03 DIAGNOSIS — E7800 Pure hypercholesterolemia, unspecified: Secondary | ICD-10-CM | POA: Diagnosis not present

## 2020-05-03 DIAGNOSIS — Z125 Encounter for screening for malignant neoplasm of prostate: Secondary | ICD-10-CM | POA: Diagnosis not present

## 2020-05-18 ENCOUNTER — Other Ambulatory Visit: Payer: Self-pay | Admitting: Orthopedic Surgery

## 2020-05-18 DIAGNOSIS — M542 Cervicalgia: Secondary | ICD-10-CM

## 2020-05-27 ENCOUNTER — Ambulatory Visit
Admission: RE | Admit: 2020-05-27 | Discharge: 2020-05-27 | Disposition: A | Discharge status: Home / Self Care | Payer: BC Managed Care – PPO | Source: Ambulatory Visit | Attending: Orthopedic Surgery | Admitting: Orthopedic Surgery

## 2020-05-27 DIAGNOSIS — M542 Cervicalgia: Secondary | ICD-10-CM

## 2020-05-27 DIAGNOSIS — M4802 Spinal stenosis, cervical region: Secondary | ICD-10-CM | POA: Diagnosis not present

## 2020-05-29 DIAGNOSIS — M532X1 Spinal instabilities, occipito-atlanto-axial region: Secondary | ICD-10-CM | POA: Diagnosis not present

## 2020-05-29 DIAGNOSIS — M40202 Unspecified kyphosis, cervical region: Secondary | ICD-10-CM | POA: Diagnosis not present

## 2020-06-06 ENCOUNTER — Ambulatory Visit
Admission: RE | Admit: 2020-06-06 | Discharge: 2020-06-06 | Disposition: A | Discharge status: Home / Self Care | Payer: BC Managed Care – PPO | Source: Ambulatory Visit | Attending: Orthopedic Surgery | Admitting: Orthopedic Surgery

## 2020-06-06 DIAGNOSIS — M47813 Spondylosis without myelopathy or radiculopathy, cervicothoracic region: Secondary | ICD-10-CM | POA: Diagnosis not present

## 2020-06-06 DIAGNOSIS — M4312 Spondylolisthesis, cervical region: Secondary | ICD-10-CM | POA: Diagnosis not present

## 2020-06-06 DIAGNOSIS — M542 Cervicalgia: Secondary | ICD-10-CM

## 2020-06-06 DIAGNOSIS — M4802 Spinal stenosis, cervical region: Secondary | ICD-10-CM | POA: Diagnosis not present

## 2020-06-06 DIAGNOSIS — M47812 Spondylosis without myelopathy or radiculopathy, cervical region: Secondary | ICD-10-CM | POA: Diagnosis not present

## 2020-06-08 DIAGNOSIS — I10 Essential (primary) hypertension: Secondary | ICD-10-CM | POA: Diagnosis not present

## 2020-06-08 DIAGNOSIS — Z125 Encounter for screening for malignant neoplasm of prostate: Secondary | ICD-10-CM | POA: Diagnosis not present

## 2020-06-08 DIAGNOSIS — E7800 Pure hypercholesterolemia, unspecified: Secondary | ICD-10-CM | POA: Diagnosis not present

## 2020-06-19 DIAGNOSIS — M532X1 Spinal instabilities, occipito-atlanto-axial region: Secondary | ICD-10-CM | POA: Diagnosis not present

## 2020-07-31 DIAGNOSIS — M532X1 Spinal instabilities, occipito-atlanto-axial region: Secondary | ICD-10-CM | POA: Diagnosis not present

## 2020-10-24 DIAGNOSIS — M40202 Unspecified kyphosis, cervical region: Secondary | ICD-10-CM | POA: Diagnosis not present

## 2020-11-22 DIAGNOSIS — M532X2 Spinal instabilities, cervical region: Secondary | ICD-10-CM | POA: Diagnosis not present

## 2020-11-22 DIAGNOSIS — Z981 Arthrodesis status: Secondary | ICD-10-CM | POA: Diagnosis not present

## 2020-11-22 DIAGNOSIS — M4322 Fusion of spine, cervical region: Secondary | ICD-10-CM | POA: Diagnosis not present

## 2020-11-22 DIAGNOSIS — I10 Essential (primary) hypertension: Secondary | ICD-10-CM | POA: Diagnosis not present

## 2020-11-22 DIAGNOSIS — M963 Postlaminectomy kyphosis: Secondary | ICD-10-CM | POA: Diagnosis not present

## 2020-11-22 DIAGNOSIS — M4316 Spondylolisthesis, lumbar region: Secondary | ICD-10-CM | POA: Diagnosis not present

## 2020-11-22 DIAGNOSIS — M4802 Spinal stenosis, cervical region: Secondary | ICD-10-CM | POA: Diagnosis not present

## 2020-11-22 DIAGNOSIS — M40202 Unspecified kyphosis, cervical region: Secondary | ICD-10-CM | POA: Diagnosis not present

## 2020-11-23 DIAGNOSIS — M4316 Spondylolisthesis, lumbar region: Secondary | ICD-10-CM | POA: Diagnosis not present

## 2020-11-23 DIAGNOSIS — M4322 Fusion of spine, cervical region: Secondary | ICD-10-CM | POA: Diagnosis not present

## 2020-12-26 DIAGNOSIS — M4802 Spinal stenosis, cervical region: Secondary | ICD-10-CM | POA: Diagnosis not present

## 2021-01-09 IMAGING — MR MR CERVICAL SPINE W/O CM
5 series · 29 of 48 positions shown · non-contrast
Comparison: MRI cervical spine 03/08/2014. CT cervical spine
06/08/2014

CLINICAL DATA: Neck pain. Right shoulder and hand pain. Cervical
fusion.

EXAM:
MRI CERVICAL SPINE WITHOUT CONTRAST
TECHNIQUE: Multiplanar, multisequence MR imaging of the cervical spine was
performed. No intravenous contrast was administered.

[Series 2: T1 · sagittal · 3.0mm · 0.41mm/px · 6 of 17 slices shown]
[im 1/17]
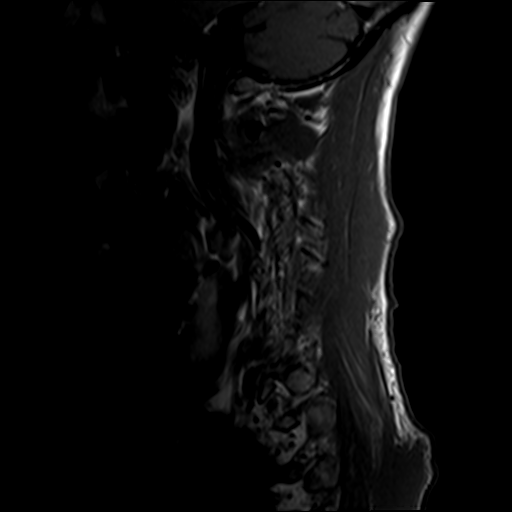
[im 4/17]
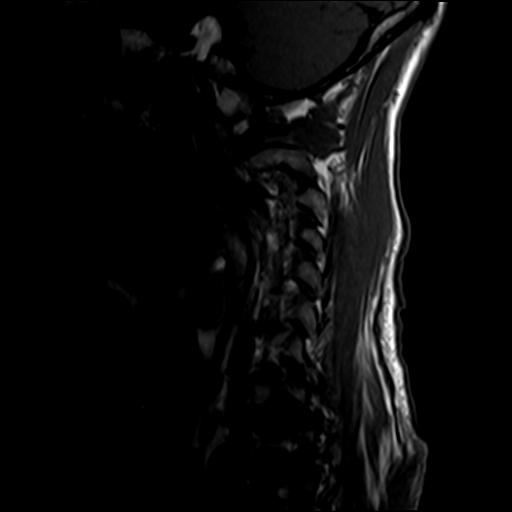
[im 7/17]
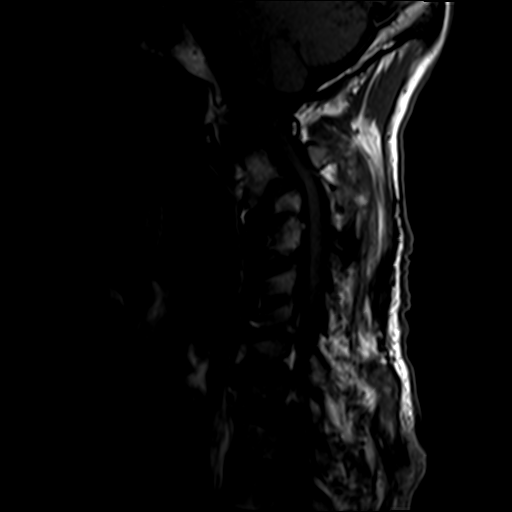
[im 10/17]
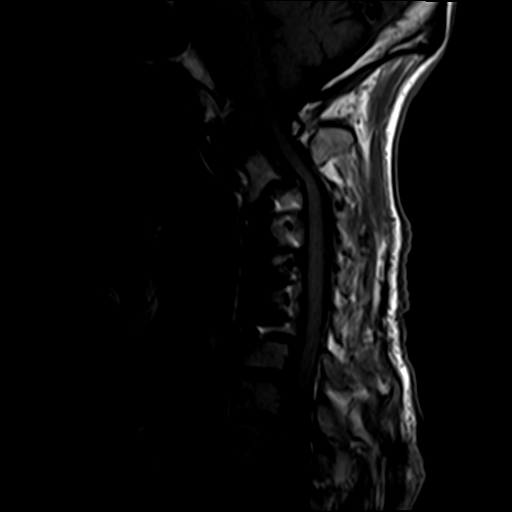
[im 13/17]
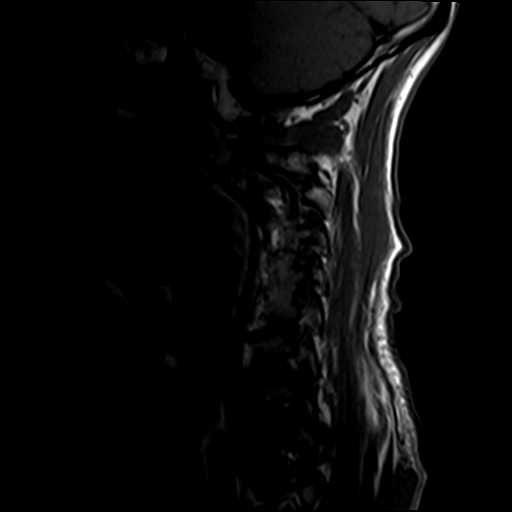
[im 17/17]
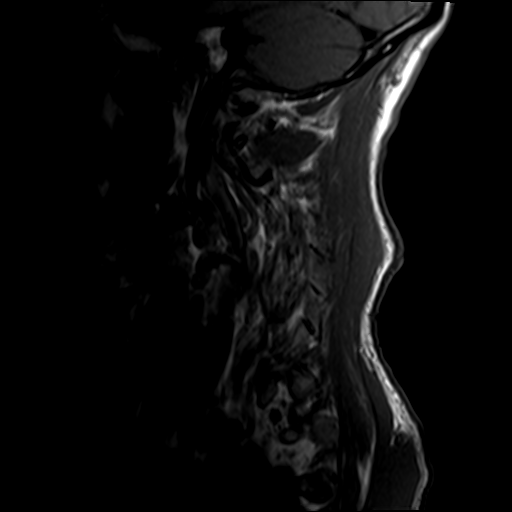

[Series 3: STIR · sagittal · 3.0mm · 0.82mm/px · 7 of 17 slices shown]
[im 1/17]
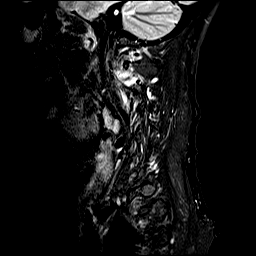
[im 3/17]
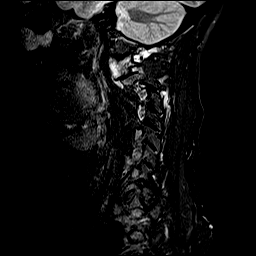
[im 6/17]
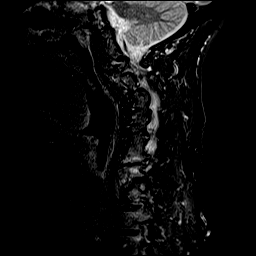
[im 9/17]
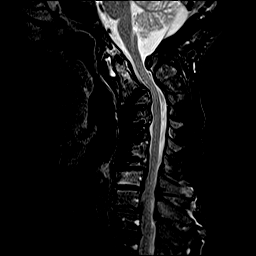
[im 11/17]
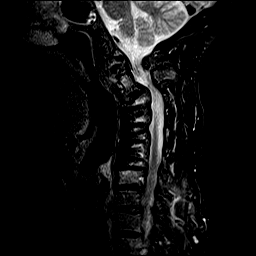
[im 14/17]
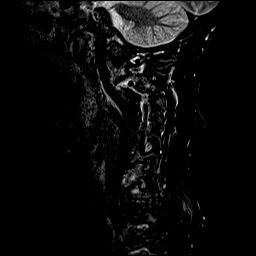
[im 17/17]
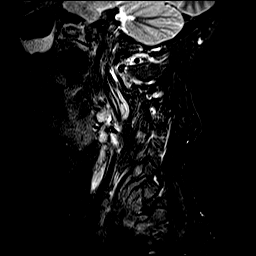

[Series 4: T2 · sagittal · 3.0mm · 0.41mm/px · 7 of 17 slices shown (1 of 2)]
[im 1/17]
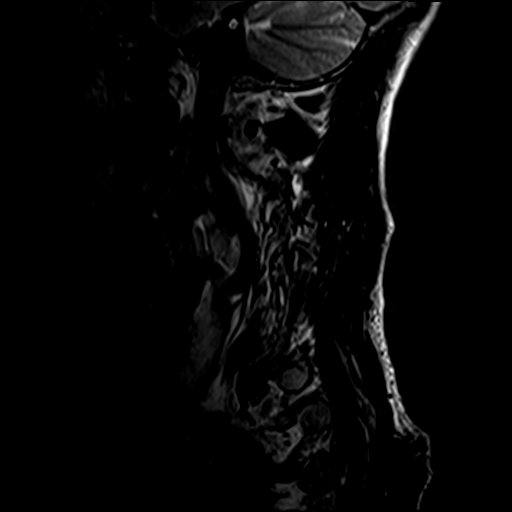
[im 3/17]
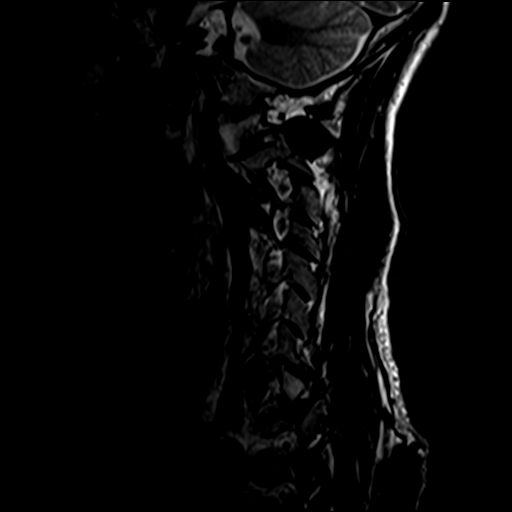
[im 6/17]
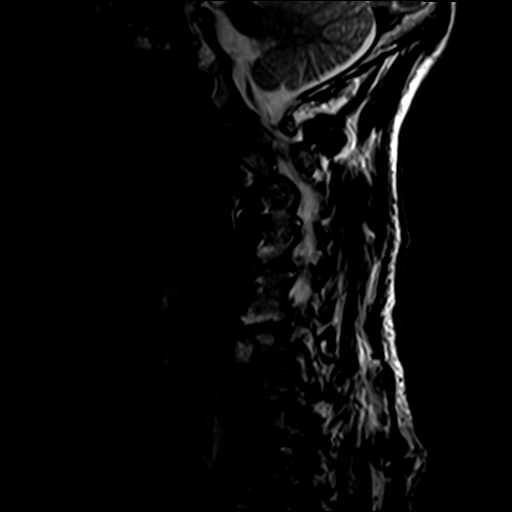
[im 9/17]
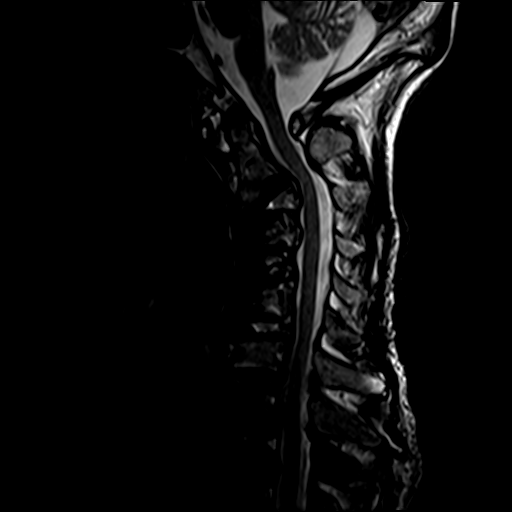
[im 11/17]
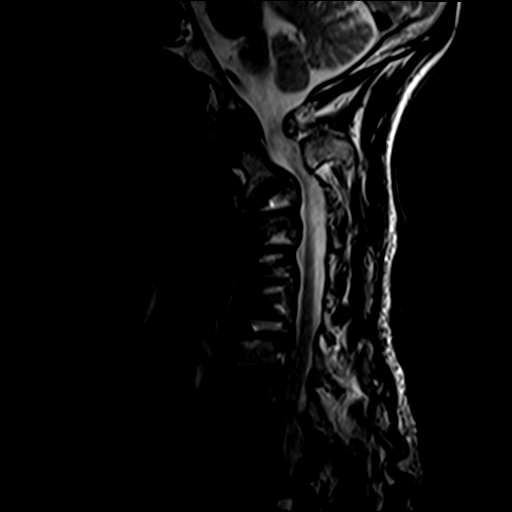
[im 14/17]
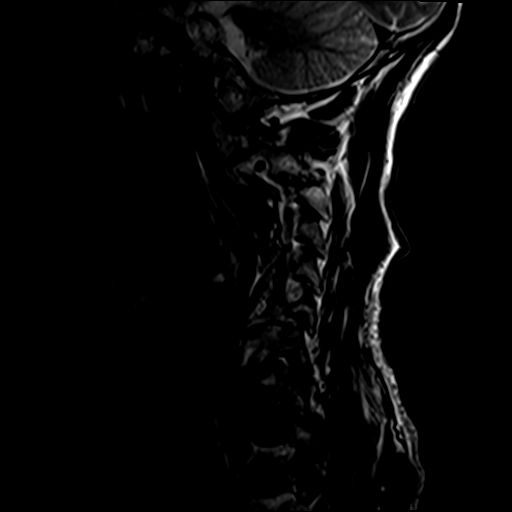
[im 17/17]
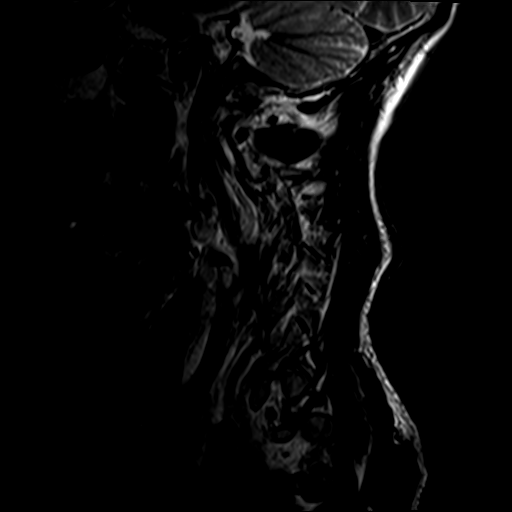

[Series 5: GRE · axial · 3.0mm · 0.35mm/px · 1 of 35 slices shown]
[im 1/35]
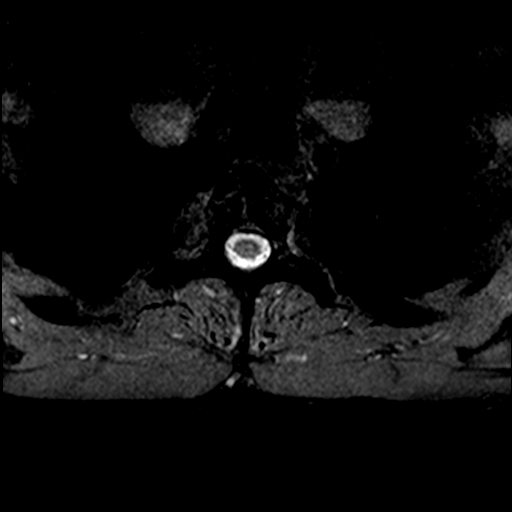

[Series 6: T2 · axial · 3.0mm · 0.70mm/px · z∈[-116,+13]mm · 8 of 35 slices shown (2 of 2)]
[im 1/35]
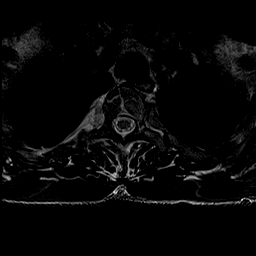
[im 6/35]
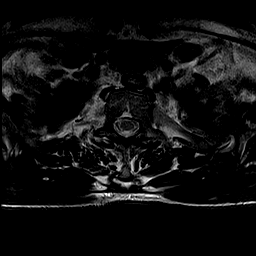
[im 11/35]
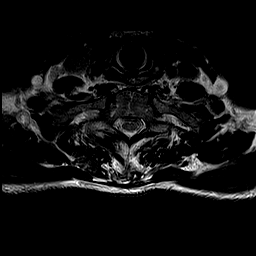
[im 16/35]
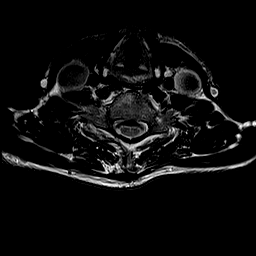
[im 19/35]
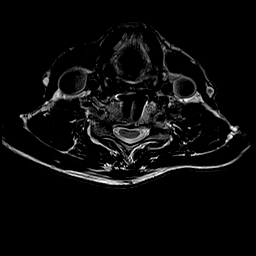
[im 24/35]
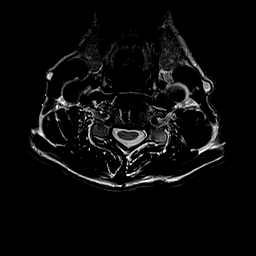
[im 29/35]
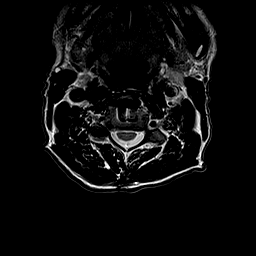
[im 35/35]
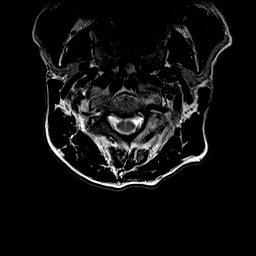

[29 of 48 positions shown; findings below may reference images not displayed]

FINDINGS: Alignment: Progressive anterolisthesis at C2-3 approximately 4 mm.
Progressive kyphosis at C2-3.

Mild retrolisthesis C6-7 unchanged.

Vertebrae: Negative for fracture or mass. Interval ACDF C3 through
C6 with anterior plate and screws.

Cord: Normal spinal cord signal.

Posterior Fossa, vertebral arteries, paraspinal tissues: Negative

Disc levels:

C2-3: 4 mm anterolisthesis with kyphotic angulation. Mild spinal
stenosis with flattening of the ventral surface of the cord.

C3-4: ACDF. Right foraminal stenosis due to spurring. Spinal canal
normal in size

C4-5: ACDF.  Negative for spinal or foraminal stenosis.

C5-6: ACDF.  Negative for spinal or foraminal stenosis.

C6-7: Moderate disc degeneration and spurring. Mild retrolisthesis.
Mild spinal stenosis. Moderate foraminal stenosis bilaterally due to
spurring.

C7-T1: Shallow central disc protrusion unchanged. Diffuse uncinate
spurring with mild spinal stenosis and mild foraminal stenosis
bilaterally.

T1-2: 3 mm anterolisthesis. Disc and facet degeneration. Mild spinal
stenosis and moderate to severe foraminal stenosis bilaterally. Mild
progression from prior studies
IMPRESSION: 1. 4 mm anterolisthesis C2-3. Progressive kyphotic angulation. Mild
spinal stenosis.
2. ACDF C3 through C6 without stenosis
3. Mild spinal stenosis C6-7 with moderate foraminal stenosis
bilaterally
4. Mild spinal stenosis and mild foraminal stenosis bilaterally
C7-T1
5. Mild spinal stenosis and moderate to severe foraminal
encroachment bilaterally T1-2 due to spurring.

## 2021-04-04 DIAGNOSIS — M40202 Unspecified kyphosis, cervical region: Secondary | ICD-10-CM | POA: Diagnosis not present

## 2021-04-04 DIAGNOSIS — I10 Essential (primary) hypertension: Secondary | ICD-10-CM | POA: Diagnosis not present

## 2021-04-11 DIAGNOSIS — R531 Weakness: Secondary | ICD-10-CM | POA: Diagnosis not present

## 2021-04-11 DIAGNOSIS — M2559 Pain in other specified joint: Secondary | ICD-10-CM | POA: Diagnosis not present

## 2021-04-11 DIAGNOSIS — M542 Cervicalgia: Secondary | ICD-10-CM | POA: Diagnosis not present

## 2021-04-11 DIAGNOSIS — M2569 Stiffness of other specified joint, not elsewhere classified: Secondary | ICD-10-CM | POA: Diagnosis not present

## 2021-08-01 DIAGNOSIS — M40202 Unspecified kyphosis, cervical region: Secondary | ICD-10-CM | POA: Diagnosis not present

## 2021-08-13 ENCOUNTER — Encounter: Payer: Self-pay | Admitting: Physical Medicine & Rehabilitation

## 2021-09-10 DIAGNOSIS — M67911 Unspecified disorder of synovium and tendon, right shoulder: Secondary | ICD-10-CM | POA: Diagnosis not present

## 2021-09-10 DIAGNOSIS — M25511 Pain in right shoulder: Secondary | ICD-10-CM | POA: Diagnosis not present

## 2021-09-21 DIAGNOSIS — M25511 Pain in right shoulder: Secondary | ICD-10-CM | POA: Diagnosis not present

## 2021-09-24 DIAGNOSIS — M25511 Pain in right shoulder: Secondary | ICD-10-CM | POA: Diagnosis not present

## 2021-09-24 DIAGNOSIS — M67911 Unspecified disorder of synovium and tendon, right shoulder: Secondary | ICD-10-CM | POA: Diagnosis not present

## 2021-10-07 DIAGNOSIS — M67813 Other specified disorders of tendon, right shoulder: Secondary | ICD-10-CM | POA: Diagnosis not present

## 2021-10-07 DIAGNOSIS — M19011 Primary osteoarthritis, right shoulder: Secondary | ICD-10-CM | POA: Diagnosis not present

## 2021-10-07 DIAGNOSIS — M7521 Bicipital tendinitis, right shoulder: Secondary | ICD-10-CM | POA: Diagnosis not present

## 2021-10-07 DIAGNOSIS — G8918 Other acute postprocedural pain: Secondary | ICD-10-CM | POA: Diagnosis not present

## 2021-10-07 DIAGNOSIS — M7541 Impingement syndrome of right shoulder: Secondary | ICD-10-CM | POA: Diagnosis not present

## 2021-10-07 DIAGNOSIS — S43431A Superior glenoid labrum lesion of right shoulder, initial encounter: Secondary | ICD-10-CM | POA: Diagnosis not present

## 2021-10-07 DIAGNOSIS — S46011A Strain of muscle(s) and tendon(s) of the rotator cuff of right shoulder, initial encounter: Secondary | ICD-10-CM | POA: Diagnosis not present

## 2021-10-08 ENCOUNTER — Encounter: Payer: Medicare Other | Admitting: Physical Medicine & Rehabilitation

## 2021-10-08 ENCOUNTER — Ambulatory Visit: Payer: BC Managed Care – PPO | Admitting: Physical Medicine & Rehabilitation

## 2021-10-09 DIAGNOSIS — M25511 Pain in right shoulder: Secondary | ICD-10-CM | POA: Diagnosis not present

## 2021-10-09 DIAGNOSIS — M25611 Stiffness of right shoulder, not elsewhere classified: Secondary | ICD-10-CM | POA: Diagnosis not present

## 2021-10-09 DIAGNOSIS — M6281 Muscle weakness (generalized): Secondary | ICD-10-CM | POA: Diagnosis not present

## 2021-10-11 DIAGNOSIS — M25611 Stiffness of right shoulder, not elsewhere classified: Secondary | ICD-10-CM | POA: Diagnosis not present

## 2021-10-11 DIAGNOSIS — M25511 Pain in right shoulder: Secondary | ICD-10-CM | POA: Diagnosis not present

## 2021-10-11 DIAGNOSIS — M6281 Muscle weakness (generalized): Secondary | ICD-10-CM | POA: Diagnosis not present

## 2021-10-15 DIAGNOSIS — M25611 Stiffness of right shoulder, not elsewhere classified: Secondary | ICD-10-CM | POA: Diagnosis not present

## 2021-10-15 DIAGNOSIS — M6281 Muscle weakness (generalized): Secondary | ICD-10-CM | POA: Diagnosis not present

## 2021-10-15 DIAGNOSIS — M25511 Pain in right shoulder: Secondary | ICD-10-CM | POA: Diagnosis not present

## 2021-10-17 DIAGNOSIS — M25611 Stiffness of right shoulder, not elsewhere classified: Secondary | ICD-10-CM | POA: Diagnosis not present

## 2021-10-17 DIAGNOSIS — M6281 Muscle weakness (generalized): Secondary | ICD-10-CM | POA: Diagnosis not present

## 2021-10-17 DIAGNOSIS — M25511 Pain in right shoulder: Secondary | ICD-10-CM | POA: Diagnosis not present

## 2021-10-23 DIAGNOSIS — M25511 Pain in right shoulder: Secondary | ICD-10-CM | POA: Diagnosis not present

## 2021-10-23 DIAGNOSIS — M6281 Muscle weakness (generalized): Secondary | ICD-10-CM | POA: Diagnosis not present

## 2021-10-23 DIAGNOSIS — M25611 Stiffness of right shoulder, not elsewhere classified: Secondary | ICD-10-CM | POA: Diagnosis not present

## 2021-10-28 DIAGNOSIS — M6281 Muscle weakness (generalized): Secondary | ICD-10-CM | POA: Diagnosis not present

## 2021-10-28 DIAGNOSIS — M25611 Stiffness of right shoulder, not elsewhere classified: Secondary | ICD-10-CM | POA: Diagnosis not present

## 2021-10-28 DIAGNOSIS — M25511 Pain in right shoulder: Secondary | ICD-10-CM | POA: Diagnosis not present

## 2021-10-30 DIAGNOSIS — M25511 Pain in right shoulder: Secondary | ICD-10-CM | POA: Diagnosis not present

## 2021-10-30 DIAGNOSIS — M6281 Muscle weakness (generalized): Secondary | ICD-10-CM | POA: Diagnosis not present

## 2021-10-30 DIAGNOSIS — M25611 Stiffness of right shoulder, not elsewhere classified: Secondary | ICD-10-CM | POA: Diagnosis not present

## 2021-11-01 DIAGNOSIS — M25611 Stiffness of right shoulder, not elsewhere classified: Secondary | ICD-10-CM | POA: Diagnosis not present

## 2021-11-01 DIAGNOSIS — M25511 Pain in right shoulder: Secondary | ICD-10-CM | POA: Diagnosis not present

## 2021-11-01 DIAGNOSIS — M6281 Muscle weakness (generalized): Secondary | ICD-10-CM | POA: Diagnosis not present

## 2021-11-06 DIAGNOSIS — M25511 Pain in right shoulder: Secondary | ICD-10-CM | POA: Diagnosis not present

## 2021-11-06 DIAGNOSIS — M6281 Muscle weakness (generalized): Secondary | ICD-10-CM | POA: Diagnosis not present

## 2021-11-06 DIAGNOSIS — M25611 Stiffness of right shoulder, not elsewhere classified: Secondary | ICD-10-CM | POA: Diagnosis not present

## 2021-11-12 DIAGNOSIS — M25611 Stiffness of right shoulder, not elsewhere classified: Secondary | ICD-10-CM | POA: Diagnosis not present

## 2021-11-12 DIAGNOSIS — M25511 Pain in right shoulder: Secondary | ICD-10-CM | POA: Diagnosis not present

## 2021-11-12 DIAGNOSIS — M6281 Muscle weakness (generalized): Secondary | ICD-10-CM | POA: Diagnosis not present

## 2021-11-19 DIAGNOSIS — M6281 Muscle weakness (generalized): Secondary | ICD-10-CM | POA: Diagnosis not present

## 2021-11-19 DIAGNOSIS — M25611 Stiffness of right shoulder, not elsewhere classified: Secondary | ICD-10-CM | POA: Diagnosis not present

## 2021-11-19 DIAGNOSIS — M25511 Pain in right shoulder: Secondary | ICD-10-CM | POA: Diagnosis not present

## 2021-11-26 DIAGNOSIS — M6281 Muscle weakness (generalized): Secondary | ICD-10-CM | POA: Diagnosis not present

## 2021-11-26 DIAGNOSIS — M25611 Stiffness of right shoulder, not elsewhere classified: Secondary | ICD-10-CM | POA: Diagnosis not present

## 2021-11-26 DIAGNOSIS — M25511 Pain in right shoulder: Secondary | ICD-10-CM | POA: Diagnosis not present

## 2021-12-03 DIAGNOSIS — M25611 Stiffness of right shoulder, not elsewhere classified: Secondary | ICD-10-CM | POA: Diagnosis not present

## 2021-12-03 DIAGNOSIS — M25511 Pain in right shoulder: Secondary | ICD-10-CM | POA: Diagnosis not present

## 2021-12-03 DIAGNOSIS — M6281 Muscle weakness (generalized): Secondary | ICD-10-CM | POA: Diagnosis not present

## 2021-12-10 DIAGNOSIS — M6281 Muscle weakness (generalized): Secondary | ICD-10-CM | POA: Diagnosis not present

## 2021-12-10 DIAGNOSIS — M25611 Stiffness of right shoulder, not elsewhere classified: Secondary | ICD-10-CM | POA: Diagnosis not present

## 2021-12-10 DIAGNOSIS — M25511 Pain in right shoulder: Secondary | ICD-10-CM | POA: Diagnosis not present

## 2021-12-30 DIAGNOSIS — E7800 Pure hypercholesterolemia, unspecified: Secondary | ICD-10-CM | POA: Diagnosis not present

## 2021-12-30 DIAGNOSIS — Z125 Encounter for screening for malignant neoplasm of prostate: Secondary | ICD-10-CM | POA: Diagnosis not present

## 2021-12-30 DIAGNOSIS — T561X1D Toxic effect of mercury and its compounds, accidental (unintentional), subsequent encounter: Secondary | ICD-10-CM | POA: Diagnosis not present

## 2021-12-30 DIAGNOSIS — I10 Essential (primary) hypertension: Secondary | ICD-10-CM | POA: Diagnosis not present

## 2022-04-02 ENCOUNTER — Telehealth: Payer: Self-pay

## 2022-04-02 NOTE — Patient Outreach (Signed)
  Care Coordination   Initial Visit Note   04/02/2022 Name: Daniel Rush MRN: [id number] DOB: July 15, 1956  Daniel Rush is a 66 y.o. year old male who sees Daniel Blamer, MD for primary care. I spoke with  Daniel Rush by phone today.  What matters to the patients health and wellness today?  None     Goals Addressed             This Visit's Progress    Care Coordination Activities-No follow up required       Care Coordination Interventions: Advised patient to schedule annual wellness exam. Patient to schedule          SDOH assessments and interventions completed:  No     Care Coordination Interventions Activated:  Yes  Care Coordination Interventions:  Yes, provided   Follow up plan: No further intervention required.   Encounter Outcome:  Pt. Visit Completed   Bary Leriche, RN, MSN [facility] Care Management Care Management Coordinator Direct Line [phone]

## 2022-04-02 NOTE — Patient Instructions (Signed)
Visit Information  Thank you for taking time to visit with me today. Please don't hesitate to contact me if I can be of assistance to you.   Following are the goals we discussed today:   Goals Addressed             This Visit's Progress    COMPLETED: Care Coordination Activities-No follow up required       Care Coordination Interventions: Advised patient to schedule annual wellness exam. Patient to schedule          If you are experiencing a Mental Health or Behavioral Health Crisis or need someone to talk to, please call the Suicide and Crisis Lifeline: 988   Patient verbalizes understanding of instructions and care plan provided today and agrees to view in MyChart. Active MyChart status and patient understanding of how to access instructions and care plan via MyChart confirmed with patient.     No further follow up required: patient declined  Bary Leriche, RN, MSN [facility] Care Management Care Management Coordinator Direct Line [phone]

## 2022-08-18 DIAGNOSIS — M79671 Pain in right foot: Secondary | ICD-10-CM | POA: Diagnosis not present

## 2022-08-18 DIAGNOSIS — E291 Testicular hypofunction: Secondary | ICD-10-CM | POA: Diagnosis not present

## 2022-08-18 DIAGNOSIS — E7800 Pure hypercholesterolemia, unspecified: Secondary | ICD-10-CM | POA: Diagnosis not present

## 2022-08-18 DIAGNOSIS — I10 Essential (primary) hypertension: Secondary | ICD-10-CM | POA: Diagnosis not present

## 2022-11-28 DIAGNOSIS — K080 Exfoliation of teeth due to systemic causes: Secondary | ICD-10-CM | POA: Diagnosis not present

## 2023-02-18 DIAGNOSIS — E7800 Pure hypercholesterolemia, unspecified: Secondary | ICD-10-CM | POA: Diagnosis not present

## 2023-02-18 DIAGNOSIS — Z125 Encounter for screening for malignant neoplasm of prostate: Secondary | ICD-10-CM | POA: Diagnosis not present

## 2023-02-18 DIAGNOSIS — I10 Essential (primary) hypertension: Secondary | ICD-10-CM | POA: Diagnosis not present

## 2023-02-18 DIAGNOSIS — L301 Dyshidrosis [pompholyx]: Secondary | ICD-10-CM | POA: Diagnosis not present

## 2023-02-18 DIAGNOSIS — E291 Testicular hypofunction: Secondary | ICD-10-CM | POA: Diagnosis not present

## 2023-02-18 DIAGNOSIS — T561X1A Toxic effect of mercury and its compounds, accidental (unintentional), initial encounter: Secondary | ICD-10-CM | POA: Diagnosis not present

## 2023-03-10 DIAGNOSIS — M1712 Unilateral primary osteoarthritis, left knee: Secondary | ICD-10-CM | POA: Diagnosis not present

## 2023-03-10 DIAGNOSIS — M542 Cervicalgia: Secondary | ICD-10-CM | POA: Diagnosis not present

## 2023-03-10 DIAGNOSIS — M79672 Pain in left foot: Secondary | ICD-10-CM | POA: Diagnosis not present

## 2023-04-09 DIAGNOSIS — M79672 Pain in left foot: Secondary | ICD-10-CM | POA: Diagnosis not present

## 2023-04-09 DIAGNOSIS — M25562 Pain in left knee: Secondary | ICD-10-CM | POA: Diagnosis not present

## 2023-04-15 DIAGNOSIS — S92302A Fracture of unspecified metatarsal bone(s), left foot, initial encounter for closed fracture: Secondary | ICD-10-CM | POA: Diagnosis not present

## 2023-04-15 DIAGNOSIS — M7672 Peroneal tendinitis, left leg: Secondary | ICD-10-CM | POA: Diagnosis not present

## 2023-05-06 DIAGNOSIS — M7672 Peroneal tendinitis, left leg: Secondary | ICD-10-CM | POA: Diagnosis not present

## 2023-05-12 DIAGNOSIS — H524 Presbyopia: Secondary | ICD-10-CM | POA: Diagnosis not present

## 2023-05-12 DIAGNOSIS — H2513 Age-related nuclear cataract, bilateral: Secondary | ICD-10-CM | POA: Diagnosis not present

## 2023-05-12 DIAGNOSIS — H5203 Hypermetropia, bilateral: Secondary | ICD-10-CM | POA: Diagnosis not present

## 2023-06-03 DIAGNOSIS — M7672 Peroneal tendinitis, left leg: Secondary | ICD-10-CM | POA: Diagnosis not present

## 2023-06-30 DIAGNOSIS — K080 Exfoliation of teeth due to systemic causes: Secondary | ICD-10-CM | POA: Diagnosis not present

## 2023-08-04 DIAGNOSIS — M7522 Bicipital tendinitis, left shoulder: Secondary | ICD-10-CM | POA: Diagnosis not present

## 2023-08-04 DIAGNOSIS — M25512 Pain in left shoulder: Secondary | ICD-10-CM | POA: Diagnosis not present

## 2023-08-28 DIAGNOSIS — Z0000 Encounter for general adult medical examination without abnormal findings: Secondary | ICD-10-CM | POA: Diagnosis not present

## 2023-08-28 DIAGNOSIS — I10 Essential (primary) hypertension: Secondary | ICD-10-CM | POA: Diagnosis not present

## 2023-08-28 DIAGNOSIS — E7800 Pure hypercholesterolemia, unspecified: Secondary | ICD-10-CM | POA: Diagnosis not present

## 2023-08-28 DIAGNOSIS — Z23 Encounter for immunization: Secondary | ICD-10-CM | POA: Diagnosis not present

## 2023-08-28 DIAGNOSIS — E291 Testicular hypofunction: Secondary | ICD-10-CM | POA: Diagnosis not present

## 2023-12-23 DIAGNOSIS — K080 Exfoliation of teeth due to systemic causes: Secondary | ICD-10-CM | POA: Diagnosis not present

## 2024-01-21 DIAGNOSIS — Z1211 Encounter for screening for malignant neoplasm of colon: Secondary | ICD-10-CM | POA: Diagnosis not present

## 2024-03-11 DIAGNOSIS — L301 Dyshidrosis [pompholyx]: Secondary | ICD-10-CM | POA: Diagnosis not present

## 2024-03-11 DIAGNOSIS — E291 Testicular hypofunction: Secondary | ICD-10-CM | POA: Diagnosis not present

## 2024-03-11 DIAGNOSIS — B001 Herpesviral vesicular dermatitis: Secondary | ICD-10-CM | POA: Diagnosis not present

## 2024-03-11 DIAGNOSIS — I10 Essential (primary) hypertension: Secondary | ICD-10-CM | POA: Diagnosis not present

## 2024-03-11 DIAGNOSIS — Z125 Encounter for screening for malignant neoplasm of prostate: Secondary | ICD-10-CM | POA: Diagnosis not present

## 2024-06-09 DIAGNOSIS — M7542 Impingement syndrome of left shoulder: Secondary | ICD-10-CM | POA: Diagnosis not present

## 2024-06-09 DIAGNOSIS — S8391XA Sprain of unspecified site of right knee, initial encounter: Secondary | ICD-10-CM | POA: Diagnosis not present

## 2024-06-09 DIAGNOSIS — Z96651 Presence of right artificial knee joint: Secondary | ICD-10-CM | POA: Diagnosis not present
